# Patient Record
Sex: Female | Born: 1945
Health system: Southern US, Community
[De-identification: ages and names within clinical notes are randomized; demographics above are authoritative.]

## PROBLEM LIST (undated history)

## (undated) ENCOUNTER — Emergency Department (HOSPITAL_COMMUNITY): Admission: EM | Payer: 59

## (undated) DIAGNOSIS — Z789 Other specified health status: Secondary | ICD-10-CM

## (undated) HISTORY — PX: CERVICAL POLYPECTOMY: SHX88

## (undated) HISTORY — PX: COLONOSCOPY: SHX174

---

## 2002-10-15 ENCOUNTER — Ambulatory Visit (HOSPITAL_COMMUNITY): Admission: RE | Admit: 2002-10-15 | Discharge: 2002-10-15 | Payer: Self-pay | Admitting: Internal Medicine

## 2002-10-15 ENCOUNTER — Encounter: Payer: Self-pay | Admitting: Internal Medicine

## 2005-03-19 ENCOUNTER — Emergency Department (HOSPITAL_COMMUNITY): Admission: EM | Admit: 2005-03-19 | Discharge: 2005-03-19 | Payer: Self-pay | Admitting: Emergency Medicine

## 2005-05-02 ENCOUNTER — Ambulatory Visit (HOSPITAL_COMMUNITY): Admission: RE | Admit: 2005-05-02 | Discharge: 2005-05-02 | Payer: Self-pay | Admitting: Internal Medicine

## 2005-05-31 ENCOUNTER — Ambulatory Visit (HOSPITAL_COMMUNITY): Admission: RE | Admit: 2005-05-31 | Discharge: 2005-05-31 | Payer: Self-pay | Admitting: Internal Medicine

## 2005-12-27 ENCOUNTER — Ambulatory Visit (HOSPITAL_COMMUNITY): Admission: RE | Admit: 2005-12-27 | Discharge: 2005-12-27 | Payer: Self-pay | Admitting: Internal Medicine

## 2006-08-08 ENCOUNTER — Ambulatory Visit (HOSPITAL_COMMUNITY): Admission: RE | Admit: 2006-08-08 | Discharge: 2006-08-08 | Payer: Self-pay | Admitting: Internal Medicine

## 2007-09-18 ENCOUNTER — Ambulatory Visit (HOSPITAL_COMMUNITY): Admission: RE | Admit: 2007-09-18 | Discharge: 2007-09-18 | Payer: Self-pay | Admitting: Internal Medicine

## 2009-01-29 ENCOUNTER — Other Ambulatory Visit: Admission: RE | Admit: 2009-01-29 | Discharge: 2009-01-29 | Payer: Self-pay | Admitting: Obstetrics and Gynecology

## 2009-05-21 ENCOUNTER — Ambulatory Visit (HOSPITAL_COMMUNITY): Admission: RE | Admit: 2009-05-21 | Discharge: 2009-05-21 | Payer: Self-pay | Admitting: Orthopaedic Surgery

## 2010-02-20 ENCOUNTER — Emergency Department (HOSPITAL_COMMUNITY): Admission: EM | Admit: 2010-02-20 | Discharge: 2010-02-20 | Payer: Self-pay | Admitting: Emergency Medicine

## 2010-04-22 ENCOUNTER — Ambulatory Visit (HOSPITAL_COMMUNITY): Admission: RE | Admit: 2010-04-22 | Discharge: 2010-04-22 | Payer: Self-pay | Admitting: Internal Medicine

## 2010-05-26 ENCOUNTER — Ambulatory Visit: Payer: Self-pay | Admitting: Otolaryngology

## 2010-06-23 ENCOUNTER — Ambulatory Visit: Payer: Self-pay | Admitting: Otolaryngology

## 2010-08-25 ENCOUNTER — Ambulatory Visit (INDEPENDENT_AMBULATORY_CARE_PROVIDER_SITE_OTHER): Payer: BC Managed Care – PPO | Admitting: Otolaryngology

## 2010-08-25 DIAGNOSIS — R070 Pain in throat: Secondary | ICD-10-CM

## 2010-09-23 LAB — DIFFERENTIAL
Basophils Absolute: 0 10*3/uL (ref 0.0–0.1)
Basophils Relative: 0 % (ref 0–1)
Eosinophils Absolute: 0.1 10*3/uL (ref 0.0–0.7)
Eosinophils Relative: 3 % (ref 0–5)
Lymphocytes Relative: 34 % (ref 12–46)
Lymphs Abs: 1.5 10*3/uL (ref 0.7–4.0)
Monocytes Absolute: 0.4 10*3/uL (ref 0.1–1.0)
Monocytes Relative: 10 % (ref 3–12)
Neutro Abs: 2.4 10*3/uL (ref 1.7–7.7)
Neutrophils Relative %: 53 % (ref 43–77)

## 2010-09-23 LAB — CBC
HCT: 35.9 % — ABNORMAL LOW (ref 36.0–46.0)
Hemoglobin: 12.3 g/dL (ref 12.0–15.0)
MCH: 31.3 pg (ref 26.0–34.0)
MCHC: 34.1 g/dL (ref 30.0–36.0)
MCV: 91.6 fL (ref 78.0–100.0)
Platelets: 191 10*3/uL (ref 150–400)
RBC: 3.92 MIL/uL (ref 3.87–5.11)
RDW: 13.4 % (ref 11.5–15.5)
WBC: 4.4 10*3/uL (ref 4.0–10.5)

## 2010-09-23 LAB — BASIC METABOLIC PANEL
BUN: 11 mg/dL (ref 6–23)
CO2: 28 mEq/L (ref 19–32)
Calcium: 9.4 mg/dL (ref 8.4–10.5)
Chloride: 102 mEq/L (ref 96–112)
Creatinine, Ser: 0.61 mg/dL (ref 0.4–1.2)
GFR calc Af Amer: >60 mL/min (ref >60)
GFR calc non Af Amer: >60 mL/min (ref >60)
Glucose, Bld: 94 mg/dL (ref 70–99)
Potassium: 4 mEq/L (ref 3.5–5.1)
Sodium: 135 mEq/L (ref 135–145)

## 2010-09-23 LAB — POCT CARDIAC MARKERS
CKMB, poc: <1 ng/mL — ABNORMAL LOW (ref 1.0–8.0)
Myoglobin, poc: 50.7 ng/mL (ref 12–200)
Troponin i, poc: <0.05 ng/mL (ref 0.00–0.09)

## 2010-11-24 ENCOUNTER — Ambulatory Visit (INDEPENDENT_AMBULATORY_CARE_PROVIDER_SITE_OTHER): Payer: BC Managed Care – PPO | Admitting: Otolaryngology

## 2010-11-24 DIAGNOSIS — R070 Pain in throat: Secondary | ICD-10-CM

## 2011-01-09 ENCOUNTER — Other Ambulatory Visit (HOSPITAL_COMMUNITY)
Admission: RE | Admit: 2011-01-09 | Discharge: 2011-01-09 | Disposition: A | Discharge status: Home / Self Care | Payer: BC Managed Care – PPO | Source: Ambulatory Visit | Attending: Obstetrics & Gynecology | Admitting: Obstetrics & Gynecology

## 2011-01-09 DIAGNOSIS — Z124 Encounter for screening for malignant neoplasm of cervix: Secondary | ICD-10-CM | POA: Insufficient documentation

## 2012-01-09 ENCOUNTER — Other Ambulatory Visit (HOSPITAL_COMMUNITY): Payer: Self-pay | Admitting: Internal Medicine

## 2012-01-09 DIAGNOSIS — Z139 Encounter for screening, unspecified: Secondary | ICD-10-CM

## 2012-01-12 ENCOUNTER — Other Ambulatory Visit (HOSPITAL_COMMUNITY)
Admission: RE | Admit: 2012-01-12 | Discharge: 2012-01-12 | Disposition: A | Discharge status: Home / Self Care | Payer: BC Managed Care – PPO | Source: Ambulatory Visit | Attending: Obstetrics & Gynecology | Admitting: Obstetrics & Gynecology

## 2012-01-12 DIAGNOSIS — Z01419 Encounter for gynecological examination (general) (routine) without abnormal findings: Secondary | ICD-10-CM | POA: Insufficient documentation

## 2012-01-16 ENCOUNTER — Ambulatory Visit (HOSPITAL_COMMUNITY)
Admission: RE | Admit: 2012-01-16 | Discharge: 2012-01-16 | Disposition: A | Discharge status: Home / Self Care | Payer: BC Managed Care – PPO | Source: Ambulatory Visit | Attending: Internal Medicine | Admitting: Internal Medicine

## 2012-01-16 DIAGNOSIS — Z1231 Encounter for screening mammogram for malignant neoplasm of breast: Secondary | ICD-10-CM | POA: Insufficient documentation

## 2012-01-16 DIAGNOSIS — Z139 Encounter for screening, unspecified: Secondary | ICD-10-CM

## 2012-04-16 DIAGNOSIS — H40059 Ocular hypertension, unspecified eye: Secondary | ICD-10-CM | POA: Diagnosis not present

## 2012-12-10 ENCOUNTER — Other Ambulatory Visit (HOSPITAL_COMMUNITY): Payer: Self-pay | Admitting: Internal Medicine

## 2012-12-10 DIAGNOSIS — Z139 Encounter for screening, unspecified: Secondary | ICD-10-CM

## 2013-01-16 ENCOUNTER — Encounter: Payer: Self-pay | Admitting: Obstetrics and Gynecology

## 2013-01-16 ENCOUNTER — Ambulatory Visit (INDEPENDENT_AMBULATORY_CARE_PROVIDER_SITE_OTHER): Payer: Medicare Other | Admitting: Obstetrics and Gynecology

## 2013-01-16 VITALS — BP 140/60 | Ht 69.75 in | Wt 136.0 lb

## 2013-01-16 DIAGNOSIS — Z124 Encounter for screening for malignant neoplasm of cervix: Secondary | ICD-10-CM

## 2013-01-16 DIAGNOSIS — Z1212 Encounter for screening for malignant neoplasm of rectum: Secondary | ICD-10-CM

## 2013-01-16 DIAGNOSIS — Z01419 Encounter for gynecological examination (general) (routine) without abnormal findings: Secondary | ICD-10-CM

## 2013-01-16 LAB — HEMOCCULT GUIAC POC 1CARD (OFFICE): Fecal Occult Blood, POC: NEGATIVE

## 2013-01-16 NOTE — Progress Notes (Signed)
Patient ID: Stacy Chase, female   DOB: Jul 15, 1945, 67 y.o.   MRN: [id number] Pt here today for her annual exam. Pt blood pressure elevated 170/72.  Assessment:  Normal Gyn Exam   Plan:  1. Mammogram advised 2. pap smear not required 3. return annually or prn  Subjective:  Stacy Chase is a 67 y.o. female No obstetric history on file. who presents for annual exam.  The patient has complaints today of  none  The following portions of the patient's history were reviewed and updated as appropriate: allergies, current medications, past family history, past medical history, past social history, past surgical history and problem list.  Review of Systems Pertinent items are noted in HPI. walks 42mi/  Objective:  BP 140/60  Ht 5' 9.75" (1.772 m)  Wt 136 lb (61.689 kg)  BMI 19.65 kg/m2  BMI: Body mass index is 19.65 kg/(m^2). General Appearance: Alert, appropriate appearance for age. No acute distress HEENT: Grossly normal Neck / Thyroid: normal Cardiovascular: RRR; normal S1, S2, no murmur Lungs: CTA bilaterally Back: No CVAT Breast Exam: No dimpling, nipple retraction or discharge. No masses or nodes., Normal breast tissue bilaterally and No masses or nodes.No dimpling, nipple retraction or discharge. Gastrointestinal: Soft, non-tender, no masses or organomegaly Pelvic Exam: Vulva and vagina appear normal. Bimanual exam reveals normal uterus and adnexa. atrophic Rectovaginal: normal rectal, no masses and guaiac negative stool obtained Lymphatic Exam: Non-palpable nodes in neck, clavicular, axillary, or inguinal regions Skin: no rash or abnormalities Neurologic: Normal gait and speech, no tremor  Psychiatric: Alert and oriented, appropriate affect.  Urinalysis:Not done  Stacy Chase. MD Pgr [phone] 10:07 AM

## 2013-01-17 ENCOUNTER — Ambulatory Visit (HOSPITAL_COMMUNITY)
Admission: RE | Admit: 2013-01-17 | Discharge: 2013-01-17 | Disposition: A | Discharge status: Home / Self Care | Payer: Medicare Other | Source: Ambulatory Visit | Attending: Internal Medicine | Admitting: Internal Medicine

## 2013-01-17 DIAGNOSIS — Z1231 Encounter for screening mammogram for malignant neoplasm of breast: Secondary | ICD-10-CM | POA: Insufficient documentation

## 2013-01-17 DIAGNOSIS — Z139 Encounter for screening, unspecified: Secondary | ICD-10-CM

## 2013-02-24 DIAGNOSIS — K219 Gastro-esophageal reflux disease without esophagitis: Secondary | ICD-10-CM | POA: Diagnosis not present

## 2013-02-24 DIAGNOSIS — Z79899 Other long term (current) drug therapy: Secondary | ICD-10-CM | POA: Diagnosis not present

## 2013-07-08 ENCOUNTER — Other Ambulatory Visit (HOSPITAL_COMMUNITY): Payer: Self-pay | Admitting: Internal Medicine

## 2013-07-08 ENCOUNTER — Ambulatory Visit (HOSPITAL_COMMUNITY)
Admission: RE | Admit: 2013-07-08 | Discharge: 2013-07-08 | Disposition: A | Discharge status: Home / Self Care | Payer: Medicare Other | Source: Ambulatory Visit | Attending: Internal Medicine | Admitting: Internal Medicine

## 2013-07-08 DIAGNOSIS — R0781 Pleurodynia: Secondary | ICD-10-CM

## 2013-07-08 DIAGNOSIS — R079 Chest pain, unspecified: Secondary | ICD-10-CM | POA: Insufficient documentation

## 2014-03-09 ENCOUNTER — Encounter (INDEPENDENT_AMBULATORY_CARE_PROVIDER_SITE_OTHER): Payer: Self-pay | Admitting: *Deleted

## 2014-03-10 ENCOUNTER — Encounter (INDEPENDENT_AMBULATORY_CARE_PROVIDER_SITE_OTHER): Payer: Self-pay

## 2014-03-17 ENCOUNTER — Other Ambulatory Visit (HOSPITAL_COMMUNITY): Payer: Self-pay | Admitting: Internal Medicine

## 2014-03-17 DIAGNOSIS — Z1231 Encounter for screening mammogram for malignant neoplasm of breast: Secondary | ICD-10-CM

## 2014-03-20 ENCOUNTER — Other Ambulatory Visit (INDEPENDENT_AMBULATORY_CARE_PROVIDER_SITE_OTHER): Payer: Self-pay | Admitting: *Deleted

## 2014-03-20 DIAGNOSIS — Z86010 Personal history of colonic polyps: Secondary | ICD-10-CM

## 2014-03-24 DIAGNOSIS — IMO0002 Reserved for concepts with insufficient information to code with codable children: Secondary | ICD-10-CM | POA: Diagnosis not present

## 2014-03-24 DIAGNOSIS — M19049 Primary osteoarthritis, unspecified hand: Secondary | ICD-10-CM | POA: Diagnosis not present

## 2014-03-25 ENCOUNTER — Ambulatory Visit (HOSPITAL_COMMUNITY)
Admission: RE | Admit: 2014-03-25 | Discharge: 2014-03-25 | Disposition: A | Discharge status: Home / Self Care | Payer: Medicare Other | Source: Ambulatory Visit | Attending: Internal Medicine | Admitting: Internal Medicine

## 2014-03-25 DIAGNOSIS — Z1231 Encounter for screening mammogram for malignant neoplasm of breast: Secondary | ICD-10-CM | POA: Insufficient documentation

## 2014-04-01 DIAGNOSIS — L259 Unspecified contact dermatitis, unspecified cause: Secondary | ICD-10-CM | POA: Diagnosis not present

## 2014-04-28 DIAGNOSIS — S83241A Other tear of medial meniscus, current injury, right knee, initial encounter: Secondary | ICD-10-CM | POA: Diagnosis not present

## 2014-05-11 ENCOUNTER — Telehealth (INDEPENDENT_AMBULATORY_CARE_PROVIDER_SITE_OTHER): Payer: Self-pay | Admitting: *Deleted

## 2014-05-11 DIAGNOSIS — Z1211 Encounter for screening for malignant neoplasm of colon: Secondary | ICD-10-CM

## 2014-05-11 NOTE — Telephone Encounter (Signed)
Patient needs movi prep 

## 2014-05-12 ENCOUNTER — Telehealth (INDEPENDENT_AMBULATORY_CARE_PROVIDER_SITE_OTHER): Payer: Self-pay | Admitting: *Deleted

## 2014-05-12 MED ORDER — PEG-KCL-NACL-NASULF-NA ASC-C 100 G PO SOLR: 1.0000 | Freq: Once | ORAL | Status: DC

## 2014-05-12 NOTE — Telephone Encounter (Signed)
Referring MD/PCP: fagan   Procedure: tcs  Reason/Indication:  Hx polyps  Has patient had this procedure before?  Yes, 2010 -- scanned  If so, when, by whom and where?    Is there a family history of colon cancer?  no  Who?  What age when diagnosed?    Is patient diabetic?   no      Does patient have prosthetic heart valve?  no  Do you have a pacemaker?  no  Has patient ever had endocarditis? no  Has patient had joint replacement within last 12 months?  no  Does patient tend to be constipated or take laxatives? no  Is patient on Coumadin, Plavix and/or Aspirin? no  Medications: none  Allergies: morphine  Medication Adjustment:   Procedure date & time: 06/10/14 at 930

## 2014-05-12 NOTE — Telephone Encounter (Signed)
agree

## 2014-06-09 DIAGNOSIS — K6389 Other specified diseases of intestine: Secondary | ICD-10-CM | POA: Diagnosis not present

## 2014-06-09 DIAGNOSIS — D128 Benign neoplasm of rectum: Secondary | ICD-10-CM

## 2014-06-09 DIAGNOSIS — Z86010 Personal history of colonic polyps: Secondary | ICD-10-CM | POA: Diagnosis not present

## 2014-06-10 ENCOUNTER — Encounter (HOSPITAL_COMMUNITY): Payer: Self-pay | Admitting: *Deleted

## 2014-06-10 ENCOUNTER — Encounter (HOSPITAL_COMMUNITY)
Admission: RE | Disposition: A | Discharge status: Home / Self Care | Payer: Self-pay | Source: Ambulatory Visit | Attending: Internal Medicine

## 2014-06-10 ENCOUNTER — Ambulatory Visit (HOSPITAL_COMMUNITY)
Admission: RE | Admit: 2014-06-10 | Discharge: 2014-06-10 | Disposition: A | Discharge status: Home / Self Care | Payer: Medicare Other | Source: Ambulatory Visit | Attending: Internal Medicine | Admitting: Internal Medicine

## 2014-06-10 DIAGNOSIS — D128 Benign neoplasm of rectum: Secondary | ICD-10-CM | POA: Diagnosis not present

## 2014-06-10 DIAGNOSIS — K621 Rectal polyp: Secondary | ICD-10-CM | POA: Insufficient documentation

## 2014-06-10 DIAGNOSIS — Z86010 Personal history of colonic polyps: Secondary | ICD-10-CM | POA: Diagnosis not present

## 2014-06-10 DIAGNOSIS — Z1211 Encounter for screening for malignant neoplasm of colon: Secondary | ICD-10-CM | POA: Insufficient documentation

## 2014-06-10 DIAGNOSIS — K644 Residual hemorrhoidal skin tags: Secondary | ICD-10-CM | POA: Diagnosis not present

## 2014-06-10 DIAGNOSIS — K6389 Other specified diseases of intestine: Secondary | ICD-10-CM | POA: Diagnosis not present

## 2014-06-10 HISTORY — DX: Other specified health status: Z78.9

## 2014-06-10 HISTORY — PX: COLONOSCOPY: SHX5424

## 2014-06-10 SURGERY — COLONOSCOPY
Anesthesia: Moderate Sedation

## 2014-06-10 MED ORDER — MEPERIDINE HCL 50 MG/ML IJ SOLN
INTRAMUSCULAR | Status: DC | PRN
  Administered 2014-06-10 (×2): 25 mg via INTRAVENOUS

## 2014-06-10 MED ORDER — MIDAZOLAM HCL 5 MG/5ML IJ SOLN
INTRAMUSCULAR | Status: AC
  Filled 2014-06-10: qty 10

## 2014-06-10 MED ORDER — MEPERIDINE HCL 50 MG/ML IJ SOLN
INTRAMUSCULAR | Status: AC
  Filled 2014-06-10: qty 1

## 2014-06-10 MED ORDER — SODIUM CHLORIDE 0.9 % IV SOLN
INTRAVENOUS | Status: DC
  Administered 2014-06-10: 09:00:00 via INTRAVENOUS

## 2014-06-10 MED ORDER — SIMETHICONE 40 MG/0.6ML PO SUSP
ORAL | Status: DC | PRN
  Administered 2014-06-10: 10:00:00

## 2014-06-10 MED ORDER — MIDAZOLAM HCL 5 MG/5ML IJ SOLN
INTRAMUSCULAR | Status: DC | PRN
Start: 2014-06-10 — End: 2014-06-10
  Administered 2014-06-10: 1 mg via INTRAVENOUS
  Administered 2014-06-10 (×2): 2 mg via INTRAVENOUS

## 2014-06-10 NOTE — H&P (Signed)
Stacy Chase is an 68 y.o. female.   Chief Complaint: Patient is here for colonoscopy. HPI: Patient is 68 year old African-American female with history of colonic adenoma and is here for surveillance colonoscopy. She denies abdominal pain change in bowel habits rectal bleeding. Family history is negative for CRC.  Past Medical History  Diagnosis Date  . Medical history non-contributory     Past Surgical History  Procedure Laterality Date  . Cervical polypectomy    . Colonoscopy      Family History  Problem Relation Age of Onset  . Hypertension Mother   . Diabetes Sister   . Cancer Sister     breast  . Hypertension Sister   . Cancer Brother     throat  . Hypertension Maternal Grandmother   . Hypertension Maternal Grandfather   . Diabetes Sister    Social History:  reports that she has never smoked. She does not have any smokeless tobacco history on file. She reports that she does not drink alcohol or use illicit drugs.  Allergies:  Allergies  Allergen Reactions  . Cortisone Rash  . Morphine And Related Rash    Medications Prior to Admission  Medication Sig Dispense Refill  . peg 3350 powder (MOVIPREP) 100 G SOLR Take 1 kit (200 g total) by mouth once. 1 kit 0    No results found for this or any previous visit (from the past 48 hour(s)). No results found.  ROS  Blood pressure 157/64, pulse 60, temperature 97.5 F (36.4 C), temperature source Oral, resp. rate 15, height 5' 10.5" (1.791 m), weight 140 lb (63.504 kg), SpO2 100 %. Physical Exam  Constitutional:  Well-developed thin African-American female in NAD  HENT:  Mouth/Throat: Oropharynx is clear and moist.  Eyes: Conjunctivae are normal. No scleral icterus.  Neck: No thyromegaly present.  Cardiovascular: Normal rate, regular rhythm and normal heart sounds.   No murmur heard. Respiratory: Effort normal and breath sounds normal.  GI: Soft. She exhibits no distension and no mass. There is no tenderness.   Musculoskeletal: She exhibits no edema.  Lymphadenopathy:    She has no cervical adenopathy.  Neurological: She is alert.  Skin: Skin is warm and dry.     Assessment/Plan History of colonic adenoma. Surveillance colonoscopy.  Holt Woolbright U 06/10/2014, 9:40 AM

## 2014-06-10 NOTE — Op Note (Signed)
COLONOSCOPY PROCEDURE REPORT  PATIENT:  Stacy Chase  MR#:  [id number] Birthdate:  Nov 11, 1945, 68 y.o., female Endoscopist:  Dr. Rogene Houston, MD Referred By:  Dr. Asencion Noble, MD  Procedure Date: 06/10/2014  Procedure:   Colonoscopy  Indications:  Patient is 68 year old African-American female was history of colonic adenoma and is here for surveillance colonoscopy. Her last exam was in September 2010.  Informed Consent:  The procedure and risks were reviewed with the patient and informed consent was obtained.  Medications:  Demerol 50 mg IV Versed 5 mg IV  Description of procedure:  After a digital rectal exam was performed, that colonoscope was advanced from the anus through the rectum and colon to the area of the cecum, ileocecal valve and appendiceal orifice. The cecum was deeply intubated. These structures were well-seen and photographed for the record. From the level of the cecum and ileocecal valve, the scope was slowly and cautiously withdrawn. The mucosal surfaces were carefully surveyed utilizing scope tip to flexion to facilitate fold flattening as needed. The scope was pulled down into the rectum where a thorough exam including retroflexion was performed.  Findings:   Prep excellent. Mild mucosal pigmentation involving distal half of the colon. Two small rectal polyps were ablated via cold biopsy and submitted together. Small hemorrhoids below the dentate line.    Therapeutic/Diagnostic Maneuvers Performed:  See above  Complications:  None  Cecal Withdrawal Time:  12 minutes  Impression:  Examination performed to cecum. Mild changes of melanosis coli involving distal half of the colon. Two small rectal polyps ablated via cold biopsy and submitted together. Small external hemorrhoids.  Recommendations:  Standard instructions given. I will contact patient with biopsy results and further recommendations.  Leannah Guse U  06/10/2014 10:21 AM  CC: Dr. Asencion Noble, MD &  Dr. Rayne Du ref. provider found

## 2014-06-10 NOTE — Discharge Instructions (Signed)
Resume usual diet. No driving for 24 hours. Physician will call with biopsy results.  Colonoscopy, Care After Refer to this sheet in the next few weeks. These instructions provide you with information on caring for yourself after your procedure. Your health care provider may also give you more specific instructions. Your treatment has been planned according to current medical practices, but problems sometimes occur. Call your health care provider if you have any problems or questions after your procedure. WHAT TO EXPECT AFTER THE PROCEDURE  After your procedure, it is typical to have the following:  A small amount of blood in your stool.  Moderate amounts of gas and mild abdominal cramping or bloating. HOME CARE INSTRUCTIONS  Do not drive, operate machinery, or sign important documents for 24 hours.  You may shower and resume your regular physical activities, but move at a slower pace for the first 24 hours.  Take frequent rest periods for the first 24 hours.  Walk around or put a warm pack on your abdomen to help reduce abdominal cramping and bloating.  Drink enough fluids to keep your urine clear or pale yellow.  You may resume your normal diet as instructed by your health care provider. Avoid heavy or fried foods that are hard to digest.  Avoid drinking alcohol for 24 hours or as instructed by your health care provider.  Only take over-the-counter or prescription medicines as directed by your health care provider.  If a tissue sample (biopsy) was taken during your procedure:  Do not take aspirin or blood thinners for 7 days, or as instructed by your health care provider.  Do not drink alcohol for 7 days, or as instructed by your health care provider.  Eat soft foods for the first 24 hours. SEEK MEDICAL CARE IF: You have persistent spotting of blood in your stool 2-3 days after the procedure. SEEK IMMEDIATE MEDICAL CARE IF:  You have more than a small spotting of blood in  your stool.  You pass large blood clots in your stool.  Your abdomen is swollen (distended).  You have nausea or vomiting.  You have a fever.  You have increasing abdominal pain that is not relieved with medicine. Document Released: 02/08/2004 Document Revised: 04/16/2013 Document Reviewed: 03/03/2013 [facility] Patient Information 2015 [place], [place]. This information is not intended to replace advice given to you by your health care provider. Make sure you discuss any questions you have with your health care provider.

## 2014-06-11 ENCOUNTER — Encounter (HOSPITAL_COMMUNITY): Payer: Self-pay | Admitting: Internal Medicine

## 2014-06-22 ENCOUNTER — Encounter (INDEPENDENT_AMBULATORY_CARE_PROVIDER_SITE_OTHER): Payer: Self-pay | Admitting: *Deleted

## 2014-11-04 DIAGNOSIS — J209 Acute bronchitis, unspecified: Secondary | ICD-10-CM | POA: Diagnosis not present

## 2015-02-05 DIAGNOSIS — L237 Allergic contact dermatitis due to plants, except food: Secondary | ICD-10-CM | POA: Diagnosis not present

## 2015-02-05 DIAGNOSIS — Z6820 Body mass index (BMI) 20.0-20.9, adult: Secondary | ICD-10-CM | POA: Diagnosis not present

## 2015-02-06 DIAGNOSIS — E785 Hyperlipidemia, unspecified: Secondary | ICD-10-CM | POA: Diagnosis not present

## 2015-02-06 DIAGNOSIS — D72819 Decreased white blood cell count, unspecified: Secondary | ICD-10-CM | POA: Diagnosis not present

## 2015-02-11 ENCOUNTER — Other Ambulatory Visit (HOSPITAL_COMMUNITY): Payer: Self-pay | Admitting: Internal Medicine

## 2015-02-11 DIAGNOSIS — Z1231 Encounter for screening mammogram for malignant neoplasm of breast: Secondary | ICD-10-CM

## 2015-03-30 DIAGNOSIS — B86 Scabies: Secondary | ICD-10-CM | POA: Diagnosis not present

## 2015-03-30 DIAGNOSIS — Z681 Body mass index (BMI) 19 or less, adult: Secondary | ICD-10-CM | POA: Diagnosis not present

## 2015-03-31 ENCOUNTER — Ambulatory Visit (HOSPITAL_COMMUNITY)
Admission: RE | Admit: 2015-03-31 | Discharge: 2015-03-31 | Disposition: A | Discharge status: Home / Self Care | Payer: Medicare Other | Source: Ambulatory Visit | Attending: Internal Medicine | Admitting: Internal Medicine

## 2015-03-31 DIAGNOSIS — Z1231 Encounter for screening mammogram for malignant neoplasm of breast: Secondary | ICD-10-CM | POA: Diagnosis not present

## 2015-05-26 DIAGNOSIS — H40023 Open angle with borderline findings, high risk, bilateral: Secondary | ICD-10-CM | POA: Diagnosis not present

## 2015-06-21 DIAGNOSIS — H40053 Ocular hypertension, bilateral: Secondary | ICD-10-CM | POA: Diagnosis not present

## 2015-08-11 DIAGNOSIS — E785 Hyperlipidemia, unspecified: Secondary | ICD-10-CM | POA: Diagnosis not present

## 2015-08-11 DIAGNOSIS — Z79899 Other long term (current) drug therapy: Secondary | ICD-10-CM | POA: Diagnosis not present

## 2015-08-19 DIAGNOSIS — R05 Cough: Secondary | ICD-10-CM | POA: Diagnosis not present

## 2015-08-19 DIAGNOSIS — Z681 Body mass index (BMI) 19 or less, adult: Secondary | ICD-10-CM | POA: Diagnosis not present

## 2015-08-19 DIAGNOSIS — E785 Hyperlipidemia, unspecified: Secondary | ICD-10-CM | POA: Diagnosis not present

## 2015-08-25 ENCOUNTER — Other Ambulatory Visit (HOSPITAL_COMMUNITY)
Admission: RE | Admit: 2015-08-25 | Discharge: 2015-08-25 | Disposition: A | Discharge status: Home / Self Care | Payer: Medicare Other | Source: Ambulatory Visit | Attending: Obstetrics and Gynecology | Admitting: Obstetrics and Gynecology

## 2015-08-25 ENCOUNTER — Ambulatory Visit (INDEPENDENT_AMBULATORY_CARE_PROVIDER_SITE_OTHER): Payer: Medicare Other | Admitting: Obstetrics and Gynecology

## 2015-08-25 ENCOUNTER — Encounter: Payer: Self-pay | Admitting: Obstetrics and Gynecology

## 2015-08-25 VITALS — BP 110/60 | Ht 69.5 in | Wt 134.0 lb

## 2015-08-25 DIAGNOSIS — Z1151 Encounter for screening for human papillomavirus (HPV): Secondary | ICD-10-CM | POA: Insufficient documentation

## 2015-08-25 DIAGNOSIS — Z124 Encounter for screening for malignant neoplasm of cervix: Secondary | ICD-10-CM | POA: Diagnosis not present

## 2015-08-25 DIAGNOSIS — Z01411 Encounter for gynecological examination (general) (routine) with abnormal findings: Secondary | ICD-10-CM | POA: Insufficient documentation

## 2015-08-25 DIAGNOSIS — Z01419 Encounter for gynecological examination (general) (routine) without abnormal findings: Secondary | ICD-10-CM

## 2015-08-25 NOTE — Progress Notes (Signed)
Patient ID: Stacy Chase, female   DOB: Aug 10, 1945, 70 y.o.   MRN: [id number] Pt here today for annual exam. Pt denies any problems or concerns at this time.

## 2015-08-25 NOTE — Progress Notes (Signed)
Patient ID: Stacy Chase, female   DOB: 10/24/1945, 70 y.o.   MRN: [id number]   Assessment:  Annual Gyn Exam Normal Exam for postmeno female. Plan:  1. pap smear done, next pap due in 2020 2. return annually or prn 3    Annual mammogram advised 4 screening  Labs by Stacy Chase 2/1 Subjective:  Yesly Stacy Chase is a 70 y.o. female with a history of cervical polpectomy who presents for annual exam. No LMP recorded. Patient is postmenopausal. The patient has no complaints. She states she has mammograms done over the past year. She states she is not currently sexually active.   PCP: Dr. Willey Chase  The following portions of the patient's history were reviewed and updated as appropriate: allergies, current medications, past family history, past medical history, past social history, past surgical history and problem list. Past Medical History  Diagnosis Date  . Medical history non-contributory     Past Surgical History  Procedure Laterality Date  . Cervical polypectomy    . Colonoscopy    . Colonoscopy N/A 06/10/2014    Procedure: COLONOSCOPY;  Surgeon: Stacy Houston, MD;  Location: [facility];  Service: Endoscopy;  Laterality: N/A;  930     Current outpatient prescriptions:  .  atorvastatin (LIPITOR) 10 MG tablet, , Disp: , Rfl:  .  benzonatate (TESSALON) 200 MG capsule, , Disp: , Rfl:   Review of Systems Constitutional: negative Gastrointestinal: negative Genitourinary: negative A complete 10 system review of systems was obtained and all systems are negative except as noted in the HPI and PMH.    Objective:  BP 110/60 mmHg  Ht 5' 9.5" (1.765 m)  Wt 134 lb (60.782 kg)  BMI 19.51 kg/m2   BMI: Body mass index is 19.51 kg/(m^2).  General Appearance: Alert, appropriate appearance for age. No acute distress HEENT: Grossly normal Neck / Thyroid:  Cardiovascular: RRR; normal S1, S2, no murmur Lungs: CTA bilaterally Back: No CVAT Breast Exam: Normal to inspection, Normal breast tissue  bilaterally and No masses or nodes.No dimpling, nipple retraction or discharge. Gastrointestinal: Soft, non-tender, no masses or organomegaly Pelvic Exam: Vulva and vagina appear normal. Bimanual exam reveals normal uterus and adnexa. External genitalia: normal general appearance Urinary system: urethral meatus normal Vaginal: good support, atrophic mucosa Cervix: normal appearance, small Adnexa: normal bimanual exam Uterus: tiny, single, nontender Rectal: good sphincter tone, no masses and guaiac negative Rectovaginal: normal rectal, no masses Lymphatic Exam: Non-palpable nodes in neck, clavicular, axillary, or inguinal regions  Skin: no rash or abnormalities Neurologic: Normal gait and speech, no tremor  Psychiatric: Alert and oriented, appropriate affect.  Urinalysis:Not done  Mallory Shirk. MD Pgr [phone] 11:33 AM   By signing my name below, I, Stacy Chase, attest that this documentation has been prepared under the direction and in the presence of Stacy Kind, MD. Electronically Signed: Stephania Chase, ED Scribe. 08/25/2015. 11:42 AM.  I personally performed the services described in this documentation, which was SCRIBED in my presence. The recorded information has been reviewed and considered accurate. It has been edited as necessary during review. Stacy Kind, MD

## 2015-08-27 LAB — CYTOLOGY - PAP

## 2016-02-29 ENCOUNTER — Other Ambulatory Visit (HOSPITAL_COMMUNITY): Payer: Self-pay | Admitting: Internal Medicine

## 2016-02-29 DIAGNOSIS — Z1231 Encounter for screening mammogram for malignant neoplasm of breast: Secondary | ICD-10-CM

## 2016-03-31 ENCOUNTER — Ambulatory Visit (HOSPITAL_COMMUNITY)
Admission: RE | Admit: 2016-03-31 | Discharge: 2016-03-31 | Disposition: A | Discharge status: Home / Self Care | Payer: Medicare Other | Source: Ambulatory Visit | Attending: Internal Medicine | Admitting: Internal Medicine

## 2016-03-31 DIAGNOSIS — Z1231 Encounter for screening mammogram for malignant neoplasm of breast: Secondary | ICD-10-CM | POA: Diagnosis not present

## 2016-08-23 DIAGNOSIS — Z79899 Other long term (current) drug therapy: Secondary | ICD-10-CM | POA: Diagnosis not present

## 2016-08-23 DIAGNOSIS — E785 Hyperlipidemia, unspecified: Secondary | ICD-10-CM | POA: Diagnosis not present

## 2016-08-30 DIAGNOSIS — Z6820 Body mass index (BMI) 20.0-20.9, adult: Secondary | ICD-10-CM | POA: Diagnosis not present

## 2016-08-30 DIAGNOSIS — Z23 Encounter for immunization: Secondary | ICD-10-CM | POA: Diagnosis not present

## 2016-08-30 DIAGNOSIS — E785 Hyperlipidemia, unspecified: Secondary | ICD-10-CM | POA: Diagnosis not present

## 2016-12-05 DIAGNOSIS — H40013 Open angle with borderline findings, low risk, bilateral: Secondary | ICD-10-CM | POA: Diagnosis not present

## 2016-12-29 ENCOUNTER — Ambulatory Visit (HOSPITAL_COMMUNITY)
Admission: RE | Admit: 2016-12-29 | Discharge: 2016-12-29 | Disposition: A | Discharge status: Home / Self Care | Payer: Medicare Other | Source: Ambulatory Visit | Attending: Internal Medicine | Admitting: Internal Medicine

## 2016-12-29 ENCOUNTER — Other Ambulatory Visit (HOSPITAL_COMMUNITY): Payer: Self-pay | Admitting: Internal Medicine

## 2016-12-29 DIAGNOSIS — M545 Low back pain: Secondary | ICD-10-CM | POA: Insufficient documentation

## 2016-12-29 DIAGNOSIS — M47816 Spondylosis without myelopathy or radiculopathy, lumbar region: Secondary | ICD-10-CM | POA: Diagnosis not present

## 2016-12-29 DIAGNOSIS — R202 Paresthesia of skin: Secondary | ICD-10-CM | POA: Diagnosis not present

## 2017-01-23 DIAGNOSIS — H2513 Age-related nuclear cataract, bilateral: Secondary | ICD-10-CM | POA: Diagnosis not present

## 2017-01-24 NOTE — Patient Instructions (Signed)
Stacy Chase  01/24/2017     @PREFPERIOPPHARMACY @   Your procedure is scheduled on 01/30/2017.  Report to [facility] at 7:00 A.M.  Call this number if you have problems the morning of surgery:  [phone]   Remember:  Do not eat food or drink liquids after midnight.  Take these medicines the morning of surgery with A SIP OF WATER None   Do not wear jewelry, make-up or nail polish.  Do not wear lotions, powders, or perfumes, or deoderant.  Do not shave 48 hours prior to surgery.  Men may shave face and neck.  Do not bring valuables to the hospital.  [facility] is not responsible for any belongings or valuables.  Contacts, dentures or bridgework may not be worn into surgery.  Leave your suitcase in the car.  After surgery it may be brought to your room.  For patients admitted to the hospital, discharge time will be determined by your treatment team.  Patients discharged the day of surgery will not be allowed to drive home.    Please read over the following fact sheets that you were given. Anesthesia Post-op Instructions     PATIENT INSTRUCTIONS POST-ANESTHESIA  IMMEDIATELY FOLLOWING SURGERY:  Do not drive or operate machinery for the first twenty four hours after surgery.  Do not make any important decisions for twenty four hours after surgery or while taking narcotic pain medications or sedatives.  If you develop intractable nausea and vomiting or a severe headache please notify your doctor immediately.  FOLLOW-UP:  Please make an appointment with your surgeon as instructed. You do not need to follow up with anesthesia unless specifically instructed to do so.  WOUND CARE INSTRUCTIONS (if applicable):  Keep a dry clean dressing on the anesthesia/puncture wound site if there is drainage.  Once the wound has quit draining you may leave it open to air.  Generally you should leave the bandage intact for twenty four hours unless there is drainage.  If the epidural site drains for  more than 36-48 hours please call the anesthesia department.  QUESTIONS?:  Please feel free to call your physician or the hospital operator if you have any questions, and they will be happy to assist you.      Cataract Surgery Cataract surgery is a procedure to remove a cataract from your eye. A cataract is cloudiness on the lens of your eye. The lens focuses light inside the eye. When a lens becomes cloudy, your vision is affected. Cataract surgery is a procedure to remove the cloudy lens. A substitute lens (intraocular lens or IOL) is usually inserted as a replacement for the cloudy lens. Tell a health care provider about:  Any allergies you have.  All medicines you are taking, including vitamins, herbs, eye drops, creams, and over-the-counter medicines.  Any problems you or family members have had with anesthetic medicines.  Any blood disorders you have.  Any surgeries you have had, especially eye surgeries that include refractive surgery, such as PRK and LASIK.  Any medical conditions you have.  Whether you are pregnant or may be pregnant. What are the risks? Generally, this is a safe procedure. However, problems may occur, including:  Infection.  Bleeding.  Glaucoma.  Retinal detachment.  Allergic reactions to medicines.  Damage to other structures or organs.  Inflammation of the eye.  Clouding of the part of your eye that holds an IOL in place (after-cataract), if an IOL was inserted. This is fairly common.  An IOL moving out of position, if an IOL was inserted. This is very rare.  Loss of vision. This is rare.  What happens before the procedure?  Follow instructions from your health care provider about eating or drinking restrictions.  Ask your health care provider about: ? Changing or stopping your regular medicines, including any eye drops you have been prescribed. This is especially important if you are taking diabetes medicines or blood thinners. ? Taking  medicines such as aspirin and ibuprofen. These medicines can thin your blood. Do not take these medicines before your procedure if your health care provider instructs you not to.  Do not put contact lenses in either eye on the day of your surgery.  Plan for someone to drive you to and from the procedure.  If you will be going home right after the procedure, plan to have someone with you for 24 hours. What happens during the procedure?  An IV tube may be inserted into one of your veins.  You will be given one or more of the following: ? A medicine to help you relax (sedative). ? A medicine to numb the area (local anesthetic). This may be numbing eye drops or an injection that is given behind the eye.  A small cut (incision) will be made to the edge of the clear, dome-shaped surface that covers the front of the eye (cornea).  A small probe will be inserted into the eye. This device gives off ultrasound waves that soften and break up the cloudy center of the lens. This makes it easier for the cloudy lens to be removed by suction.  An IOL may be implanted.  Part of the capsule that surrounds the lens will be left in the eye to support the IOL.  Your surgeon may use stitches (sutures) to close the incision. The procedure may vary among health care providers and hospitals. What happens after the procedure?  Your blood pressure, heart rate, breathing rate, and blood oxygen level will be monitored often until the medicines you were given have worn off.  You may be given a protective shield to wear over your eyes.  Do not drive for 24 hours if you received a sedative. This information is not intended to replace advice given to you by your health care provider. Make sure you discuss any questions you have with your health care provider. Document Released: 06/15/2011 Document Revised: 12/02/2015 Document Reviewed: 05/06/2015 Elsevier Interactive Patient Education  2017 [employer].

## 2017-01-25 ENCOUNTER — Encounter (HOSPITAL_COMMUNITY): Payer: Self-pay

## 2017-01-25 ENCOUNTER — Encounter (HOSPITAL_COMMUNITY)
Admission: RE | Admit: 2017-01-25 | Discharge: 2017-01-25 | Disposition: A | Discharge status: Home / Self Care | Payer: Medicare Other | Source: Ambulatory Visit | Attending: Ophthalmology | Admitting: Ophthalmology

## 2017-01-25 DIAGNOSIS — R9431 Abnormal electrocardiogram [ECG] [EKG]: Secondary | ICD-10-CM | POA: Insufficient documentation

## 2017-01-25 DIAGNOSIS — Z01810 Encounter for preprocedural cardiovascular examination: Secondary | ICD-10-CM | POA: Diagnosis not present

## 2017-01-25 DIAGNOSIS — Z01812 Encounter for preprocedural laboratory examination: Secondary | ICD-10-CM | POA: Diagnosis not present

## 2017-01-25 LAB — BASIC METABOLIC PANEL
Anion gap: 6 (ref 5–15)
BUN: 15 mg/dL (ref 6–20)
CALCIUM: 9.2 mg/dL (ref 8.9–10.3)
CO2: 28 mmol/L (ref 22–32)
CREATININE: 0.77 mg/dL (ref 0.44–1.00)
Chloride: 106 mmol/L (ref 101–111)
GFR calc Af Amer: >60 mL/min (ref >60)
GFR calc non Af Amer: >60 mL/min (ref >60)
Glucose, Bld: 82 mg/dL (ref 65–99)
Potassium: 4 mmol/L (ref 3.5–5.1)
Sodium: 140 mmol/L (ref 135–145)

## 2017-01-25 LAB — CBC
HCT: 35.8 % — ABNORMAL LOW (ref 36.0–46.0)
Hemoglobin: 11.7 g/dL — ABNORMAL LOW (ref 12.0–15.0)
MCH: 30.5 pg (ref 26.0–34.0)
MCHC: 32.7 g/dL (ref 30.0–36.0)
MCV: 93.2 fL (ref 78.0–100.0)
Platelets: 191 10*3/uL (ref 150–400)
RBC: 3.84 MIL/uL — ABNORMAL LOW (ref 3.87–5.11)
RDW: 13.6 % (ref 11.5–15.5)
WBC: 5.2 10*3/uL (ref 4.0–10.5)

## 2017-01-30 ENCOUNTER — Ambulatory Visit (HOSPITAL_COMMUNITY): Payer: Medicare Other | Admitting: Anesthesiology

## 2017-01-30 ENCOUNTER — Encounter (HOSPITAL_COMMUNITY)
Admission: RE | Disposition: A | Discharge status: Home / Self Care | Payer: Self-pay | Source: Ambulatory Visit | Attending: Ophthalmology

## 2017-01-30 ENCOUNTER — Ambulatory Visit (HOSPITAL_COMMUNITY)
Admission: RE | Admit: 2017-01-30 | Discharge: 2017-01-30 | Disposition: A | Discharge status: Home / Self Care | Payer: Medicare Other | Source: Ambulatory Visit | Attending: Ophthalmology | Admitting: Ophthalmology

## 2017-01-30 DIAGNOSIS — Z888 Allergy status to other drugs, medicaments and biological substances status: Secondary | ICD-10-CM | POA: Insufficient documentation

## 2017-01-30 DIAGNOSIS — Z885 Allergy status to narcotic agent status: Secondary | ICD-10-CM | POA: Diagnosis not present

## 2017-01-30 DIAGNOSIS — H269 Unspecified cataract: Secondary | ICD-10-CM | POA: Diagnosis not present

## 2017-01-30 DIAGNOSIS — H2511 Age-related nuclear cataract, right eye: Secondary | ICD-10-CM | POA: Diagnosis not present

## 2017-01-30 DIAGNOSIS — H2512 Age-related nuclear cataract, left eye: Secondary | ICD-10-CM | POA: Diagnosis not present

## 2017-01-30 HISTORY — PX: CATARACT EXTRACTION W/PHACO: SHX586

## 2017-01-30 SURGERY — PHACOEMULSIFICATION, CATARACT, WITH IOL INSERTION
Anesthesia: Monitor Anesthesia Care | Site: Eye | Laterality: Left

## 2017-01-30 MED ORDER — TETRACAINE HCL 0.5 % OP SOLN
1.0000 [drp] | OPHTHALMIC | Status: AC
  Administered 2017-01-30 (×3): 1 [drp] via OPHTHALMIC

## 2017-01-30 MED ORDER — CYCLOPENTOLATE-PHENYLEPHRINE 0.2-1 % OP SOLN
1.0000 [drp] | OPHTHALMIC | Status: AC
  Administered 2017-01-30 (×3): 1 [drp] via OPHTHALMIC

## 2017-01-30 MED ORDER — FENTANYL CITRATE (PF) 100 MCG/2ML IJ SOLN
25.0000 ug | Freq: Once | INTRAMUSCULAR | Status: AC
  Administered 2017-01-30: 25 ug via INTRAVENOUS

## 2017-01-30 MED ORDER — LACTATED RINGERS IV SOLN
INTRAVENOUS | Status: DC
  Administered 2017-01-30: 08:00:00 via INTRAVENOUS

## 2017-01-30 MED ORDER — BSS IO SOLN
INTRAOCULAR | Status: DC | PRN
  Administered 2017-01-30: 15 mL

## 2017-01-30 MED ORDER — PROVISC 10 MG/ML IO SOLN
INTRAOCULAR | Status: DC | PRN
  Administered 2017-01-30: 0.85 mL via INTRAOCULAR

## 2017-01-30 MED ORDER — MIDAZOLAM HCL 2 MG/2ML IJ SOLN
1.0000 mg | INTRAMUSCULAR | Status: AC
  Administered 2017-01-30 (×2): 1 mg via INTRAVENOUS

## 2017-01-30 MED ORDER — KETOROLAC TROMETHAMINE 0.5 % OP SOLN
1.0000 [drp] | OPHTHALMIC | Status: AC
  Administered 2017-01-30 (×3): 1 [drp] via OPHTHALMIC

## 2017-01-30 MED ORDER — PHENYLEPHRINE HCL 2.5 % OP SOLN
1.0000 [drp] | OPHTHALMIC | Status: AC
  Administered 2017-01-30 (×3): 1 [drp] via OPHTHALMIC

## 2017-01-30 MED ORDER — EPINEPHRINE PF 1 MG/ML IJ SOLN
INTRAOCULAR | Status: DC | PRN
  Administered 2017-01-30: 500 mL

## 2017-01-30 MED ORDER — MIDAZOLAM HCL 2 MG/2ML IJ SOLN
INTRAMUSCULAR | Status: AC
  Filled 2017-01-30: qty 2

## 2017-01-30 MED ORDER — FENTANYL CITRATE (PF) 100 MCG/2ML IJ SOLN
INTRAMUSCULAR | Status: AC
  Filled 2017-01-30: qty 2

## 2017-01-30 SURGICAL SUPPLY — 11 items
CLOTH BEACON ORANGE TIMEOUT ST (SAFETY) ×3 IMPLANT
EYE SHIELD UNIVERSAL CLEAR (GAUZE/BANDAGES/DRESSINGS) ×3 IMPLANT
GLOVE BIO SURGEON STRL SZ 6.5 (GLOVE) ×2 IMPLANT
GLOVE BIO SURGEONS STRL SZ 6.5 (GLOVE) ×1
GLOVE BIOGEL PI IND STRL 7.0 (GLOVE) ×1 IMPLANT
GLOVE BIOGEL PI INDICATOR 7.0 (GLOVE) ×2
LENS ALC ACRYL/TECN (Ophthalmic Related) ×3 IMPLANT
PAD ARMBOARD 7.5X6 YLW CONV (MISCELLANEOUS) ×3 IMPLANT
TAPE SURG TRANSPORE 1 IN (GAUZE/BANDAGES/DRESSINGS) ×1 IMPLANT
TAPE SURGICAL TRANSPORE 1 IN (GAUZE/BANDAGES/DRESSINGS) ×2
WATER STERILE IRR 250ML POUR (IV SOLUTION) ×3 IMPLANT

## 2017-01-30 NOTE — H&P (Signed)
The patient was re examined and there is no change in the patients condition since the original H and P. 

## 2017-01-30 NOTE — Transfer of Care (Signed)
Immediate Anesthesia Transfer of Care Note  Patient: Stacy Chase  Procedure(s) Performed: Procedure(s) with comments: CATARACT EXTRACTION PHACO AND INTRAOCULAR LENS PLACEMENT (IOC) (Left) - CDE: 14.94  Patient Location: PACU  Anesthesia Type:MAC  Level of Consciousness: awake and alert   Airway & Oxygen Therapy: Patient Spontanous Breathing  Post-op Assessment: Report given to RN and Post -op Vital signs reviewed and stable  Post vital signs: Reviewed and stable  Last Vitals:  Vitals:   01/30/17 0825 01/30/17 0827  BP:    Resp: 10   Temp:  36.7 C    Last Pain:  Vitals:   01/30/17 0827  TempSrc: Oral      Patients Stated Pain Goal: 4 (93/57/01 7793)  Complications: No apparent anesthesia complications

## 2017-01-30 NOTE — Discharge Instructions (Signed)
°  °          [facility] Instructions °[address]- [place] [address] °    ° °1. Avoid closing eyes tightly. One often closes the eye tightly when laughing, talking, sneezing, coughing or if they feel irritated. At these times, you should be careful not to close your eyes tightly. ° °2. Instill eye drops as instructed. To instill drops in your eye, open it, look up and have someone gently pull the lower lid down and instill a couple of drops inside the lower lid. ° °3. Do not touch upper lid. ° °4. Take Advil or Tylenol for pain. ° °5. You may use either eye for near work, such as reading or sewing and you may watch television. ° °6. You may have your hair done at the beauty parlor at any time. ° °7. Wear dark glasses with or without your own glasses if you are in bright light. ° °8. Call our office at [phone] or [phone] if you have sharp pain in your eye or unusual symptoms. ° °9.  FOLLOW UP WITH DR. SHAPIRO TODAY IN HIS [place] OFFICE AT 2:45pm. ° °  °I have received a copy of the above instructions and will follow them.  ° ° ° °IF YOU ARE IN IMMEDIATE DANGER CALL 911! ° °It is important for you to keep your follow-up appointment with your physician after discharge, OR, for you /your caregiver to make a follow-up appointment with your physician / medical provider after discharge. ° °Show these instructions to the next healthcare provider you see. ° °

## 2017-01-30 NOTE — Anesthesia Preprocedure Evaluation (Signed)
Anesthesia Evaluation  Patient identified by MRN, date of birth, ID band Patient awake    Reviewed: Allergy & Precautions, NPO status , Patient's Chart, lab work & pertinent test results  Airway Mallampati: I  TM Distance: >3 FB     Dental  (+) Teeth Intact   Pulmonary neg pulmonary ROS,    breath sounds clear to auscultation       Cardiovascular negative cardio ROS   Rhythm:Regular Rate:Normal     Neuro/Psych negative neurological ROS  negative psych ROS   GI/Hepatic negative GI ROS, Neg liver ROS,   Endo/Other  negative endocrine ROS  Renal/GU negative Renal ROS     Musculoskeletal   Abdominal   Peds  Hematology   Anesthesia Other Findings   Reproductive/Obstetrics                             Anesthesia Physical Anesthesia Plan  ASA: I  Anesthesia Plan: MAC   Post-op Pain Management:    Induction: Intravenous  PONV Risk Score and Plan:   Airway Management Planned: Nasal Cannula  Additional Equipment:   Intra-op Plan:   Post-operative Plan:   Informed Consent: I have reviewed the patients History and Physical, chart, labs and discussed the procedure including the risks, benefits and alternatives for the proposed anesthesia with the patient or authorized representative who has indicated his/her understanding and acceptance.     Plan Discussed with:   Anesthesia Plan Comments:         Anesthesia Quick Evaluation

## 2017-01-30 NOTE — Anesthesia Procedure Notes (Signed)
Procedure Name: MAC Date/Time: 01/30/2017 8:41 AM Performed by: Vista Deck Pre-anesthesia Checklist: Patient identified, Emergency Drugs available, Suction available, Timeout performed and Patient being monitored Patient Re-evaluated:Patient Re-evaluated prior to induction Oxygen Delivery Method: Nasal Cannula

## 2017-01-30 NOTE — Anesthesia Postprocedure Evaluation (Signed)
Anesthesia Post Note  Patient: Stacy Chase  Procedure(s) Performed: Procedure(s) (LRB): CATARACT EXTRACTION PHACO AND INTRAOCULAR LENS PLACEMENT (IOC) (Left)  Patient location during evaluation: PACU Anesthesia Type: MAC Level of consciousness: awake and alert Pain management: satisfactory to patient Vital Signs Assessment: post-procedure vital signs reviewed and stable Respiratory status: spontaneous breathing Cardiovascular status: stable Postop Assessment: no signs of nausea or vomiting Anesthetic complications: no     Last Vitals:  Vitals:   01/30/17 0825 01/30/17 0827  BP:    Resp: 10   Temp:  36.7 C    Last Pain:  Vitals:   01/30/17 0827  TempSrc: Oral                 Adhira Jamil

## 2017-01-30 NOTE — Op Note (Signed)
Patient brought to the operating room and prepped and draped in the usual manner.  Lid speculum inserted in left eye.  Stab incision made at the twelve o'clock position.  Provisc instilled in the anterior chamber.   A 2.4 mm. Stab incision was made temporally.  An anterior capsulotomy was done with a bent 25 gauge needle.  The nucleus was hydrodissected.  The Phaco tip was inserted in the anterior chamber and the nucleus was emulsified.  CDE was 14.94.  The cortical material was then removed with the I and A tip.  Posterior capsule was the polished.  The anterior chamber was deepened with Provisc.  A 22.5 Diopter Alcon AU00T0 IOL was then inserted in the capsular bag.  Provisc was then removed with the I and A tip.  The wound was then hydrated.  Patient sent to the Recovery Room in good condition with follow up in my office.  Preoperative Diagnosis:  Nuclear Cataract OS Postoperative Diagnosis:  Same Procedure name: Kelman Phacoemulsification OS with IOL

## 2017-01-31 ENCOUNTER — Encounter (HOSPITAL_COMMUNITY): Payer: Self-pay | Admitting: Ophthalmology

## 2017-02-26 ENCOUNTER — Other Ambulatory Visit (HOSPITAL_COMMUNITY): Payer: Self-pay | Admitting: Internal Medicine

## 2017-02-26 DIAGNOSIS — Z1231 Encounter for screening mammogram for malignant neoplasm of breast: Secondary | ICD-10-CM

## 2017-02-27 ENCOUNTER — Encounter (HOSPITAL_COMMUNITY)
Admission: RE | Admit: 2017-02-27 | Discharge: 2017-02-27 | Disposition: A | Discharge status: Home / Self Care | Payer: Medicare Other | Source: Ambulatory Visit | Attending: Ophthalmology | Admitting: Ophthalmology

## 2017-02-27 ENCOUNTER — Encounter (HOSPITAL_COMMUNITY): Payer: Self-pay

## 2017-03-06 ENCOUNTER — Ambulatory Visit (HOSPITAL_COMMUNITY): Payer: Medicare Other | Admitting: Anesthesiology

## 2017-03-06 ENCOUNTER — Encounter (HOSPITAL_COMMUNITY)
Admission: RE | Disposition: A | Discharge status: Home / Self Care | Payer: Self-pay | Source: Ambulatory Visit | Attending: Ophthalmology

## 2017-03-06 ENCOUNTER — Encounter (HOSPITAL_COMMUNITY): Payer: Self-pay | Admitting: *Deleted

## 2017-03-06 ENCOUNTER — Ambulatory Visit (HOSPITAL_COMMUNITY)
Admission: RE | Admit: 2017-03-06 | Discharge: 2017-03-06 | Disposition: A | Discharge status: Home / Self Care | Payer: Medicare Other | Source: Ambulatory Visit | Attending: Ophthalmology | Admitting: Ophthalmology

## 2017-03-06 DIAGNOSIS — H269 Unspecified cataract: Secondary | ICD-10-CM | POA: Diagnosis not present

## 2017-03-06 DIAGNOSIS — H2511 Age-related nuclear cataract, right eye: Secondary | ICD-10-CM | POA: Diagnosis not present

## 2017-03-06 DIAGNOSIS — Z888 Allergy status to other drugs, medicaments and biological substances status: Secondary | ICD-10-CM | POA: Insufficient documentation

## 2017-03-06 HISTORY — PX: CATARACT EXTRACTION W/PHACO: SHX586

## 2017-03-06 SURGERY — PHACOEMULSIFICATION, CATARACT, WITH IOL INSERTION
Anesthesia: Monitor Anesthesia Care | Site: Eye | Laterality: Right

## 2017-03-06 MED ORDER — TETRACAINE 0.5 % OP SOLN OPTIME - NO CHARGE
OPHTHALMIC | Status: DC | PRN
  Administered 2017-03-06: 2 [drp] via OPHTHALMIC

## 2017-03-06 MED ORDER — LACTATED RINGERS IV SOLN
INTRAVENOUS | Status: DC
  Administered 2017-03-06: 09:00:00 via INTRAVENOUS

## 2017-03-06 MED ORDER — PROVISC 10 MG/ML IO SOLN
INTRAOCULAR | Status: DC | PRN
  Administered 2017-03-06: 0.85 mL via INTRAOCULAR

## 2017-03-06 MED ORDER — CYCLOPENTOLATE-PHENYLEPHRINE 0.2-1 % OP SOLN
1.0000 [drp] | OPHTHALMIC | Status: AC
  Administered 2017-03-06 (×3): 1 [drp] via OPHTHALMIC

## 2017-03-06 MED ORDER — BSS IO SOLN
INTRAOCULAR | Status: DC | PRN
  Administered 2017-03-06: 15 mL

## 2017-03-06 MED ORDER — PHENYLEPHRINE HCL 2.5 % OP SOLN
1.0000 [drp] | OPHTHALMIC | Status: AC
  Administered 2017-03-06 (×3): 1 [drp] via OPHTHALMIC

## 2017-03-06 MED ORDER — KETOROLAC TROMETHAMINE 0.5 % OP SOLN
1.0000 [drp] | OPHTHALMIC | Status: AC
  Administered 2017-03-06 (×3): 1 [drp] via OPHTHALMIC

## 2017-03-06 MED ORDER — EPINEPHRINE PF 1 MG/ML IJ SOLN
INTRAOCULAR | Status: DC | PRN
  Administered 2017-03-06: 500 mL

## 2017-03-06 MED ORDER — TETRACAINE HCL 0.5 % OP SOLN
1.0000 [drp] | OPHTHALMIC | Status: AC
  Administered 2017-03-06 (×3): 1 [drp] via OPHTHALMIC

## 2017-03-06 SURGICAL SUPPLY — 11 items
CLOTH BEACON ORANGE TIMEOUT ST (SAFETY) ×3 IMPLANT
EYE SHIELD UNIVERSAL CLEAR (GAUZE/BANDAGES/DRESSINGS) ×3 IMPLANT
GLOVE BIO SURGEON STRL SZ 6.5 (GLOVE) ×2 IMPLANT
GLOVE BIO SURGEONS STRL SZ 6.5 (GLOVE) ×1
GLOVE BIOGEL PI IND STRL 7.0 (GLOVE) ×1 IMPLANT
GLOVE BIOGEL PI INDICATOR 7.0 (GLOVE) ×2
LENS ALC ACRYL/TECN (Ophthalmic Related) ×3 IMPLANT
PAD ARMBOARD 7.5X6 YLW CONV (MISCELLANEOUS) ×3 IMPLANT
TAPE SURG TRANSPORE 1 IN (GAUZE/BANDAGES/DRESSINGS) ×1 IMPLANT
TAPE SURGICAL TRANSPORE 1 IN (GAUZE/BANDAGES/DRESSINGS) ×2
WATER STERILE IRR 250ML POUR (IV SOLUTION) ×3 IMPLANT

## 2017-03-06 NOTE — Anesthesia Preprocedure Evaluation (Signed)
Anesthesia Evaluation  Patient identified by MRN, date of birth, ID band Patient awake    Airway Mallampati: I       Dental no notable dental hx. (+) Teeth Intact   Pulmonary neg pulmonary ROS,    Pulmonary exam normal breath sounds clear to auscultation       Cardiovascular negative cardio ROS Normal cardiovascular exam Rhythm:Regular Rate:Normal     Neuro/Psych negative neurological ROS  negative psych ROS   GI/Hepatic negative GI ROS, Neg liver ROS,   Endo/Other  negative endocrine ROS  Renal/GU negative Renal ROS  negative genitourinary   Musculoskeletal negative musculoskeletal ROS (+)   Abdominal   Peds  Hematology negative hematology ROS (+)   Anesthesia Other Findings   Reproductive/Obstetrics negative OB ROS                             Anesthesia Physical Anesthesia Plan  ASA: II  Anesthesia Plan: MAC   Post-op Pain Management:    Induction: Intravenous  PONV Risk Score and Plan: Ondansetron  Airway Management Planned: Nasal Cannula and Natural Airway  Additional Equipment:   Intra-op Plan:   Post-operative Plan:   Informed Consent: I have reviewed the patients History and Physical, chart, labs and discussed the procedure including the risks, benefits and alternatives for the proposed anesthesia with the patient or authorized representative who has indicated his/her understanding and acceptance.   Dental advisory given  Plan Discussed with: CRNA  Anesthesia Plan Comments:         Anesthesia Quick Evaluation

## 2017-03-06 NOTE — Discharge Instructions (Signed)
°  °        [facility] Instructions °[address]- [place] [address] °    ° °1. Avoid closing eyes tightly. One often closes the eye tightly when laughing, talking, sneezing, coughing or if they feel irritated. At these times, you should be careful not to close your eyes tightly. ° °2. Instill eye drops as instructed. To instill drops in your eye, open it, look up and have someone gently pull the lower lid down and instill a couple of drops inside the lower lid. ° °3. Do not touch upper lid. ° °4. Take Advil or Tylenol for pain. ° °5. You may use either eye for near work, such as reading or sewing and you may watch television. ° °6. You may have your hair done at the beauty parlor at any time. ° °7. Wear dark glasses with or without your own glasses if you are in bright light. ° °8. Call our office at [phone] or [phone] if you have sharp pain in your eye or unusual symptoms. ° °9.  FOLLOW UP WITH DR. SHAPIRO TODAY IN HIS [place] OFFICE AT 2:45pm. ° °  °I have received a copy of the above instructions and will follow them.  ° ° ° °IF YOU ARE IN IMMEDIATE DANGER CALL 911! ° °It is important for you to keep your follow-up appointment with your physician after discharge, OR, for you /your caregiver to make a follow-up appointment with your physician / medical provider after discharge. ° °Show these instructions to the next healthcare provider you see. ° ° °Moderate Conscious Sedation, Adult, Care After °These instructions provide you with information about caring for yourself after your procedure. Your health care provider may also give you more specific instructions. Your treatment has been planned according to current medical practices, but problems sometimes occur. Call your health care provider if you have any problems or questions after your procedure. °What can I expect after the procedure? °After your procedure, it is common: °· To feel sleepy for several  hours. °· To feel clumsy and have poor balance for several hours. °· To have poor judgment for several hours. °· To vomit if you eat too soon. ° °Follow these instructions at home: °For at least 24 hours after the procedure: ° °· Do not: °? Participate in activities where you could fall or become injured. °? Drive. °? Use heavy machinery. °? Drink alcohol. °? Take sleeping pills or medicines that cause drowsiness. °? Make important decisions or sign legal documents. °? Take care of children on your own. °· Rest. °Eating and drinking °· Follow the diet recommended by your health care provider. °· If you vomit: °? Drink water, juice, or soup when you can drink without vomiting. °? Make sure you have little or no nausea before eating solid foods. °General instructions °· Have a responsible adult stay with you until you are awake and alert. °· Take over-the-counter and prescription medicines only as told by your health care provider. °· If you smoke, do not smoke without supervision. °· Keep all follow-up visits as told by your health care provider. This is important. °Contact a health care provider if: °· You keep feeling nauseous or you keep vomiting. °· You feel light-headed. °· You develop a rash. °· You have a fever. °Get help right away if: °· You have trouble breathing. °This information is not intended to replace advice given to you by your health care provider. Make sure you   discuss any questions you have with your health care provider. °Document Released: 04/16/2013 Document Revised: 11/29/2015 Document Reviewed: 10/16/2015 °Elsevier Interactive Patient Education © 2018 [employer] ° ° °

## 2017-03-06 NOTE — Anesthesia Postprocedure Evaluation (Signed)
Anesthesia Post Note  Patient: Stacy Chase  Procedure(s) Performed: Procedure(s) (LRB): CATARACT EXTRACTION PHACO AND INTRAOCULAR LENS PLACEMENT (IOC) (Right)  Patient location during evaluation: Short Stay Anesthesia Type: MAC Level of consciousness: awake and alert, oriented and patient cooperative Pain management: pain level controlled Vital Signs Assessment: post-procedure vital signs reviewed and stable Respiratory status: spontaneous breathing, nonlabored ventilation and respiratory function stable Cardiovascular status: blood pressure returned to baseline Postop Assessment: no signs of nausea or vomiting Anesthetic complications: no     Last Vitals:  Vitals:   03/06/17 0836 03/06/17 0840  BP:  (!) 173/74  Resp:  13  Temp: 36.6 C   SpO2:  100%    Last Pain:  Vitals:   03/06/17 0836  TempSrc: Oral                 Voncile Schwarz J

## 2017-03-06 NOTE — H&P (Signed)
The patient was re examined and there is no change in the patients condition since the original H and P. 

## 2017-03-06 NOTE — Op Note (Signed)
Patient brought to the operating room and prepped and draped in the usual manner.  Lid speculum inserted in right eye.  Stab incision made at the twelve o'clock position.  Provisc instilled in the anterior chamber.   A 2.4 mm. Stab incision was made temporally.  An anterior capsulotomy was done with a bent 25 gauge needle.  The nucleus was hydrodissected.  The Phaco tip was inserted in the anterior chamber and the nucleus was emulsified.  CDE was 5.93.  The cortical material was then removed with the I and A tip.  Posterior capsule was the polished.  The anterior chamber was deepened with Provisc.  A 22.5 Diopter Alcon AU00T0 IOL was then inserted in the capsular bag.  Provisc was then removed with the I and A tip.  The wound was then hydrated.  Patient sent to the Recovery Room in good condition with follow up in my office.  Preoperative Diagnosis:  Nuclear Cataract OD Postoperative Diagnosis:  Same Procedure name: Kelman Phacoemulsification OD with IOL

## 2017-03-06 NOTE — Transfer of Care (Signed)
Immediate Anesthesia Transfer of Care Note  Patient: Stacy Chase  Procedure(s) Performed: Procedure(s) with comments: CATARACT EXTRACTION PHACO AND INTRAOCULAR LENS PLACEMENT (IOC) (Right) - CDE: 5.93  Patient Location: Short Stay  Anesthesia Type:MAC  Level of Consciousness: awake and patient cooperative  Airway & Oxygen Therapy: Patient Spontanous Breathing  Post-op Assessment: Report given to RN and Post -op Vital signs reviewed and stable  Post vital signs: Reviewed and stable  Last Vitals:  Vitals:   03/06/17 0836 03/06/17 0840  BP:  (!) 173/74  Resp:  13  Temp: 36.6 C   SpO2:  100%    Last Pain:  Vitals:   03/06/17 0836  TempSrc: Oral      Patients Stated Pain Goal: 8 (01/64/29 0379)  Complications: No apparent anesthesia complications

## 2017-03-07 ENCOUNTER — Encounter (HOSPITAL_COMMUNITY): Payer: Self-pay | Admitting: Ophthalmology

## 2017-04-02 ENCOUNTER — Ambulatory Visit (HOSPITAL_COMMUNITY)
Admission: RE | Admit: 2017-04-02 | Discharge: 2017-04-02 | Disposition: A | Discharge status: Home / Self Care | Payer: Medicare Other | Source: Ambulatory Visit | Attending: Internal Medicine | Admitting: Internal Medicine

## 2017-04-02 DIAGNOSIS — Z1231 Encounter for screening mammogram for malignant neoplasm of breast: Secondary | ICD-10-CM | POA: Insufficient documentation

## 2017-06-26 DIAGNOSIS — Z961 Presence of intraocular lens: Secondary | ICD-10-CM | POA: Diagnosis not present

## 2017-09-26 DIAGNOSIS — K219 Gastro-esophageal reflux disease without esophagitis: Secondary | ICD-10-CM | POA: Diagnosis not present

## 2017-09-26 DIAGNOSIS — E785 Hyperlipidemia, unspecified: Secondary | ICD-10-CM | POA: Diagnosis not present

## 2017-09-26 DIAGNOSIS — Z79899 Other long term (current) drug therapy: Secondary | ICD-10-CM | POA: Diagnosis not present

## 2017-10-03 DIAGNOSIS — Z23 Encounter for immunization: Secondary | ICD-10-CM | POA: Diagnosis not present

## 2017-10-03 DIAGNOSIS — E785 Hyperlipidemia, unspecified: Secondary | ICD-10-CM | POA: Diagnosis not present

## 2017-10-03 DIAGNOSIS — Z681 Body mass index (BMI) 19 or less, adult: Secondary | ICD-10-CM | POA: Diagnosis not present

## 2017-10-05 ENCOUNTER — Other Ambulatory Visit (HOSPITAL_COMMUNITY): Payer: Self-pay | Admitting: Internal Medicine

## 2017-10-05 DIAGNOSIS — Z780 Asymptomatic menopausal state: Secondary | ICD-10-CM

## 2017-10-15 ENCOUNTER — Ambulatory Visit (HOSPITAL_COMMUNITY)
Admission: RE | Admit: 2017-10-15 | Discharge: 2017-10-15 | Disposition: A | Discharge status: Home / Self Care | Payer: Medicare Other | Source: Ambulatory Visit | Attending: Internal Medicine | Admitting: Internal Medicine

## 2017-10-15 DIAGNOSIS — M85851 Other specified disorders of bone density and structure, right thigh: Secondary | ICD-10-CM | POA: Insufficient documentation

## 2017-10-15 DIAGNOSIS — M8588 Other specified disorders of bone density and structure, other site: Secondary | ICD-10-CM | POA: Insufficient documentation

## 2017-10-15 DIAGNOSIS — Z780 Asymptomatic menopausal state: Secondary | ICD-10-CM | POA: Diagnosis not present

## 2017-10-15 DIAGNOSIS — M8589 Other specified disorders of bone density and structure, multiple sites: Secondary | ICD-10-CM | POA: Diagnosis not present

## 2017-12-08 ENCOUNTER — Other Ambulatory Visit: Payer: Self-pay

## 2017-12-08 ENCOUNTER — Emergency Department (HOSPITAL_COMMUNITY)
Admission: EM | Admit: 2017-12-08 | Discharge: 2017-12-08 | Disposition: A | Discharge status: Home / Self Care | Payer: Medicare Other | Attending: Emergency Medicine | Admitting: Emergency Medicine

## 2017-12-08 ENCOUNTER — Encounter (HOSPITAL_COMMUNITY): Payer: Self-pay | Admitting: Emergency Medicine

## 2017-12-08 DIAGNOSIS — L299 Pruritus, unspecified: Secondary | ICD-10-CM | POA: Diagnosis not present

## 2017-12-08 DIAGNOSIS — W57XXXA Bitten or stung by nonvenomous insect and other nonvenomous arthropods, initial encounter: Secondary | ICD-10-CM | POA: Insufficient documentation

## 2017-12-08 DIAGNOSIS — Y939 Activity, unspecified: Secondary | ICD-10-CM | POA: Diagnosis not present

## 2017-12-08 DIAGNOSIS — Y999 Unspecified external cause status: Secondary | ICD-10-CM | POA: Diagnosis not present

## 2017-12-08 DIAGNOSIS — Y929 Unspecified place or not applicable: Secondary | ICD-10-CM | POA: Diagnosis not present

## 2017-12-08 DIAGNOSIS — S80862A Insect bite (nonvenomous), left lower leg, initial encounter: Secondary | ICD-10-CM | POA: Diagnosis not present

## 2017-12-08 MED ORDER — TRIAMCINOLONE ACETONIDE 0.1 % EX CREA: 1.0000 "application " | TOPICAL_CREAM | Freq: Three times a day (TID) | CUTANEOUS | 0 refills | Status: DC

## 2017-12-08 NOTE — ED Provider Notes (Signed)
[facility] EMERGENCY DEPARTMENT Provider Note   CSN: [id number] Arrival date & time: 12/08/17  1317     History   Chief Complaint Chief Complaint  Patient presents with  . Tick Removal    HPI Stacy Chase is a 72 y.o. female.  HPI  Stacy Chase is a 72 y.o. female who presents to the Emergency Department complaining of itching to her left leg secondary to a tick bite.  She states that she removed a tick from her left lower leg yesterday and noticed redness and itching to the site this morning.  She has been cleaning the area with alcohol.  Tick was not engorged at the time of removal.  She denies fever, chills, arthralgias and rash.  Past Medical History:  Diagnosis Date  . Medical history non-contributory     There are no active problems to display for this patient.   Past Surgical History:  Procedure Laterality Date  . CATARACT EXTRACTION W/PHACO Left 01/30/2017   Procedure: CATARACT EXTRACTION PHACO AND INTRAOCULAR LENS PLACEMENT (IOC);  Surgeon: Rutherford Guys, MD;  Location: [facility] ORS;  Service: Ophthalmology;  Laterality: Left;  CDE: 14.94  . CATARACT EXTRACTION W/PHACO Right 03/06/2017   Procedure: CATARACT EXTRACTION PHACO AND INTRAOCULAR LENS PLACEMENT (IOC);  Surgeon: Rutherford Guys, MD;  Location: [facility] OR[facility];  Service: Ophthalmology;  Laterality: Right;  CDE: 5.93  . CERVICAL POLYPECTOMY    . COLONOSCOPY    . COLONOSCOPY N/A 06/10/2014   Procedure: COLONOSCOPY;  Surgeon: Rogene Houston, MD;  Location: [facility] ENDO SUITE;  Service: Endoscopy;  Laterality: N/A;  930     OB History   None      Home Medications    Prior to Admission medications   Medication Sig Start Date End Date Taking? Authorizing Provider  triamcinolone cream (KENALOG) 0.1 % Apply 1 application topically 3 (three) times daily. 12/08/17   Kem Parkinson, PA-C    Family History Family History  Problem Relation Age of Onset  . Hypertension Mother   . Diabetes Sister   . Cancer Sister        breast    . Hypertension Sister   . Cancer Brother        throat  . Hypertension Maternal Grandmother   . Hypertension Maternal Grandfather   . Diabetes Sister     Social History Social History   Tobacco Use  . Smoking status: Never Smoker  . Smokeless tobacco: Never Used  Substance Use Topics  . Alcohol use: No  . Drug use: No     Allergies   Cortisone and Morphine and related   Review of Systems Review of Systems  Constitutional: Negative for chills and fever.  Gastrointestinal: Negative for abdominal pain, nausea and vomiting.  Musculoskeletal: Negative for arthralgias and myalgias.  Skin:       Tick bite  Neurological: Negative for dizziness, weakness and numbness.     Physical Exam Updated Vital Signs BP 138/65 (BP Location: Right Arm)   Pulse 74   Temp 97.7 F (36.5 C) (Oral)   Resp 15   Ht 5\' 9"  (1.753 m)   Wt 59.9 kg (132 lb)   SpO2 100%   BMI 19.49 kg/m   Physical Exam  Constitutional: She is oriented to person, place, and time. She appears well-developed and well-nourished. No distress.  Cardiovascular: Normal rate and regular rhythm.  Pulmonary/Chest: Effort normal. No respiratory distress.  Musculoskeletal: Normal range of motion.  Neurological: She is alert and oriented to  person, place, and time. No sensory deficit.  Skin: Skin is warm.  1 cm erythematous papule to the posterior left lower leg.  No surrounding erythema.  No target lesions.  No remaining tick parts seen.  Psychiatric: She has a normal mood and affect.  Nursing note and vitals reviewed.    ED Treatments / Results  Labs (all labs ordered are listed, but only abnormal results are displayed) Labs Reviewed - No data to display  EKG None  Radiology No results found.  Procedures Procedures (including critical care time)  Medications Ordered in ED Medications - No data to display   Initial Impression / Assessment and Plan / ED Course  I have reviewed the triage vital signs  and the nursing notes.  Pertinent labs & imaging results that were available during my care of the patient were reviewed by me and considered in my medical decision making (see chart for details).     Patient with tick bite, no associated symptoms.  Take removed prior to arrival.  treatment with steroid cream and over-the-counter Benadryl if needed for itching.  Patient advised of symptoms associated with tick related illness and patient agrees to close follow-up with PCP if needed  Final Clinical Impressions(s) / ED Diagnoses   Final diagnoses:  Tick bite, initial encounter    ED Discharge Orders        Ordered    triamcinolone cream (KENALOG) 0.1 %  3 times daily     12/08/17 Marshall, [place], PA-C 12/08/17 1524    Francine Graven, DO 12/09/17 1123

## 2017-12-08 NOTE — Discharge Instructions (Signed)
You may clean the area with mild soap and water and apply the cream as directed.  You may take over-the-counter Benadryl 1 capsule every 6 hours if needed for itching.  Follow-up with your primary doctor or return to the ER for any worsening symptoms such as fever rash, joint pains, or flulike symptoms.

## 2017-12-08 NOTE — ED Triage Notes (Signed)
Patient complaining of tick bite yesterday to back of left leg. Only complaint is itching to area.

## 2018-01-30 ENCOUNTER — Emergency Department (HOSPITAL_COMMUNITY): Payer: No Typology Code available for payment source

## 2018-01-30 ENCOUNTER — Other Ambulatory Visit: Payer: Self-pay

## 2018-01-30 ENCOUNTER — Emergency Department (HOSPITAL_COMMUNITY)
Admission: EM | Admit: 2018-01-30 | Discharge: 2018-01-30 | Disposition: A | Discharge status: Home / Self Care | Payer: No Typology Code available for payment source | Attending: Emergency Medicine | Admitting: Emergency Medicine

## 2018-01-30 ENCOUNTER — Encounter (HOSPITAL_COMMUNITY): Payer: Self-pay | Admitting: Emergency Medicine

## 2018-01-30 DIAGNOSIS — S199XXA Unspecified injury of neck, initial encounter: Secondary | ICD-10-CM | POA: Diagnosis not present

## 2018-01-30 DIAGNOSIS — S0083XA Contusion of other part of head, initial encounter: Secondary | ICD-10-CM | POA: Diagnosis not present

## 2018-01-30 DIAGNOSIS — Y92413 State road as the place of occurrence of the external cause: Secondary | ICD-10-CM | POA: Diagnosis not present

## 2018-01-30 DIAGNOSIS — S0990XA Unspecified injury of head, initial encounter: Secondary | ICD-10-CM | POA: Diagnosis not present

## 2018-01-30 DIAGNOSIS — M542 Cervicalgia: Secondary | ICD-10-CM | POA: Insufficient documentation

## 2018-01-30 DIAGNOSIS — R0689 Other abnormalities of breathing: Secondary | ICD-10-CM | POA: Diagnosis not present

## 2018-01-30 DIAGNOSIS — Y998 Other external cause status: Secondary | ICD-10-CM | POA: Diagnosis not present

## 2018-01-30 DIAGNOSIS — Y9389 Activity, other specified: Secondary | ICD-10-CM | POA: Diagnosis not present

## 2018-01-30 DIAGNOSIS — S40012A Contusion of left shoulder, initial encounter: Secondary | ICD-10-CM | POA: Insufficient documentation

## 2018-01-30 DIAGNOSIS — S4992XA Unspecified injury of left shoulder and upper arm, initial encounter: Secondary | ICD-10-CM | POA: Diagnosis not present

## 2018-01-30 DIAGNOSIS — M25512 Pain in left shoulder: Secondary | ICD-10-CM | POA: Diagnosis not present

## 2018-01-30 DIAGNOSIS — I10 Essential (primary) hypertension: Secondary | ICD-10-CM | POA: Diagnosis not present

## 2018-01-30 DIAGNOSIS — R51 Headache: Secondary | ICD-10-CM | POA: Diagnosis not present

## 2018-01-30 NOTE — ED Notes (Signed)
Pt complaining of left facial pain as well as left shoulder pain.
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
## 2018-01-30 NOTE — Discharge Instructions (Addendum)
Your blood pressure is slightly above the normal, otherwise your vital signs are within normal limits.  Your oxygen level is 100% on room air.  Within normal limits by my interpretation.  You can expect to be sore over the next few days.  Please use Tylenol extra strength every 4 hours.  Please see your primary physician or return to the emergency department if any changes, problems, or concerns.

## 2018-02-06 DIAGNOSIS — S0083XA Contusion of other part of head, initial encounter: Secondary | ICD-10-CM | POA: Diagnosis not present

## 2018-02-20 DIAGNOSIS — S134XXA Sprain of ligaments of cervical spine, initial encounter: Secondary | ICD-10-CM | POA: Diagnosis not present

## 2018-02-25 ENCOUNTER — Other Ambulatory Visit (HOSPITAL_COMMUNITY): Payer: Self-pay | Admitting: Internal Medicine

## 2018-02-25 DIAGNOSIS — Z1231 Encounter for screening mammogram for malignant neoplasm of breast: Secondary | ICD-10-CM

## 2018-03-07 DIAGNOSIS — S161XXD Strain of muscle, fascia and tendon at neck level, subsequent encounter: Secondary | ICD-10-CM | POA: Diagnosis not present

## 2018-03-21 ENCOUNTER — Other Ambulatory Visit: Payer: Self-pay

## 2018-03-21 ENCOUNTER — Ambulatory Visit (HOSPITAL_COMMUNITY): Payer: Medicare Other | Attending: Internal Medicine | Admitting: Occupational Therapy

## 2018-03-21 ENCOUNTER — Encounter (HOSPITAL_COMMUNITY): Payer: Self-pay | Admitting: Occupational Therapy

## 2018-03-21 DIAGNOSIS — M542 Cervicalgia: Secondary | ICD-10-CM | POA: Diagnosis not present

## 2018-03-21 DIAGNOSIS — M25512 Pain in left shoulder: Secondary | ICD-10-CM | POA: Insufficient documentation

## 2018-03-21 DIAGNOSIS — G8929 Other chronic pain: Secondary | ICD-10-CM | POA: Diagnosis not present

## 2018-03-21 DIAGNOSIS — R29898 Other symptoms and signs involving the musculoskeletal system: Secondary | ICD-10-CM | POA: Diagnosis not present

## 2018-03-21 NOTE — Patient Instructions (Signed)
1) Seated Row   Sit up straight with elbows by your sides. Pull back with shoulders/elbows, keeping forearms straight, as if pulling back on the reins of a horse. Squeeze shoulder blades together. Repeat _10__times, __2__sets/day    2) Shoulder Elevation    Sit up straight with arms by your sides. Slowly bring your shoulders up towards your ears. Repeat_10__times, _2__ sets/day    3) Shoulder Extension    Sit up straight with both arms by your side, draw your arms back behind your waist. Keep your elbows straight. Repeat ___10_times, _2___sets/day.       AROM: Lateral Neck Flexion   Slowly tilt head toward one shoulder, then the other. Hold each position _5___ seconds. Repeat __10__ times per set. Do __1__ sets per session. Do _2___ sessions per day.  http://orth.exer.us/296   Copyright  VHI. All rights reserved.  AROM: Neck Extension   Bend head backward. Hold __5__ seconds. Repeat _10___ times per set. Do ___1_ sets per session. Do __2__ sessions per day.  http://orth.exer.us/300   Copyright  VHI. All rights reserved.  AROM: Neck Flexion   Bend head forward. Hold _5___ seconds. Repeat __10__ times per set. Do __1__ sets per session. Do 2__ sessions per day.  http://orth.exer.us/298   Copyright  VHI. All rights reserved.  AROM: Neck Rotation   Turn head slowly to look over one shoulder, then the other. Hold each position __5__ seconds. Repeat _10___ times per set. Do __1__ sets per session. Do __2__ sessions per day.  http://orth.exer.us/294   Copyright  VHI. All rights reserved.
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
## 2018-03-26 ENCOUNTER — Telehealth (HOSPITAL_COMMUNITY): Payer: Self-pay | Admitting: Internal Medicine

## 2018-03-26 NOTE — Telephone Encounter (Signed)
03/26/18  Left patient a message to offer an appointment on Wed. 9/18 at 3:15 with Mickel Baas since she was on our wait list

## 2018-03-29 ENCOUNTER — Encounter

## 2018-04-03 ENCOUNTER — Ambulatory Visit (HOSPITAL_COMMUNITY): Payer: Medicare Other

## 2018-04-03 ENCOUNTER — Encounter (HOSPITAL_COMMUNITY): Payer: Self-pay

## 2018-04-03 DIAGNOSIS — M25512 Pain in left shoulder: Secondary | ICD-10-CM

## 2018-04-03 DIAGNOSIS — M542 Cervicalgia: Secondary | ICD-10-CM | POA: Diagnosis not present

## 2018-04-03 DIAGNOSIS — R29898 Other symptoms and signs involving the musculoskeletal system: Secondary | ICD-10-CM | POA: Diagnosis not present

## 2018-04-03 NOTE — Therapy (Signed)
[place], [place], [address] Phone: [phone]   Fax:  [phone]  Occupational Therapy Treatment  Patient Details  Name: PELAGIA Chase MRN: [id number] Date of Birth: 04/25/1946 Referring Provider: Asencion Noble   Encounter Date: 04/03/2018  OT End of Session - 04/03/18 1551    Visit Number  2    Number of Visits  16    Date for OT Re-Evaluation  04/20/18    Authorization Type  1) Medicare Part A 2)[employer]    OT Start Time  1520    OT Stop Time  1600    OT Time Calculation (min)  40 min    Activity Tolerance  Patient tolerated treatment well    Behavior During Therapy  [facility] for tasks assessed/performed       Past Medical History:  Diagnosis Date  . Medical history non-contributory     Past Surgical History:  Procedure Laterality Date  . CATARACT EXTRACTION W/PHACO Left 01/30/2017   Procedure: CATARACT EXTRACTION PHACO AND INTRAOCULAR LENS PLACEMENT (IOC);  Surgeon: Rutherford Guys, MD;  Location: [facility];  Service: Ophthalmology;  Laterality: Left;  CDE: 14.94  . CATARACT EXTRACTION W/PHACO Right 03/06/2017   Procedure: CATARACT EXTRACTION PHACO AND INTRAOCULAR LENS PLACEMENT (IOC);  Surgeon: Rutherford Guys, MD;  Location: [facility];  Service: Ophthalmology;  Laterality: Right;  CDE: 5.93  . CERVICAL POLYPECTOMY    . COLONOSCOPY    . COLONOSCOPY N/A 06/10/2014   Procedure: COLONOSCOPY;  Surgeon: Rogene Houston, MD;  [facility];  Service: Endoscopy;  Laterality: N/A;  930    There were no vitals filed for this visit.  Subjective Assessment - 04/03/18 1538    Subjective   S: It feels a lot better than it did.     Currently in Pain?  No/denies         [facility] OT Assessment - 04/03/18 1538      Assessment   Medical Diagnosis  Left Shoulder pain      Precautions   Precautions  None               OT Treatments/Exercises (OP) - 04/03/18 1538      Exercises   Exercises   Shoulder      Shoulder Exercises: Supine   Protraction  PROM;5 reps    Horizontal ABduction  PROM;5 reps    External Rotation  PROM;5 reps    Internal Rotation  PROM;5 reps    Flexion  PROM;5 reps    ABduction  PROM;5 reps      Shoulder Exercises: Standing   Protraction  AAROM;10 reps    Horizontal ABduction  AROM;10 reps    External Rotation  AROM;10 reps    Internal Rotation  AROM;10 reps    Flexion  Strengthening;10 reps    Shoulder Flexion Weight (lbs)  1    ABduction  AAROM;10 reps      Shoulder Exercises: ROM/Strengthening   Wall Wash  1'      Manual Therapy   Manual Therapy  Soft tissue mobilization    Manual therapy comments  Manual therapy completed prior to exercises.     Soft tissue mobilization  Myofasical release and manual stretching completed to left upper arm, trapezius, scapularis, and cervical region to decrease fascial restrictions and increase joint mobility in a pain free zone.                OT Short Term Goals -  04/03/18 1542      OT SHORT TERM GOAL #1   Title  Patient will be educated on and become independent using HEP to be able to use LUE during ADLs.       Time  4    Period  Weeks    Status  On-going      OT SHORT TERM GOAL #2   Title  Patient will decrease pain in LUE to 2/10 or less to be able to use LUE during food preparation.    Time  4    Period  Weeks    Status  On-going      OT SHORT TERM GOAL #3   Title  Pt will improve cervical A/ROM to improve ability to turn head while driving.        Time  4    Period  Weeks    Status  On-going      OT SHORT TERM GOAL #4   Title  Pt will increase LUE strength to at least 4+/5 to improve ability to carry items during yard work tasks.       Time  4    Period  Weeks    Status  On-going      OT SHORT TERM GOAL #5   Title  Pt will decrease fascial restrictions in LUE to minimal amounts or less to improve mobility required for functional reaching tasks.    Time  4    Period  Weeks     Status  On-going      OT SHORT TERM GOAL #6   Title  Pt will improve left shoulder A/ROM to improve ability to reach overhead while putting away groceries.     Time  4    Period  Weeks    Status  On-going           Plan - 04/03/18 1551    Clinical Impression Statement  A: Initiated myofascial release, manual stretching, some AA/ROM and A/ROM exercies. patient presents with muscle fatigue during session which required additional exercise modifications. HEP was not updated at this session but may need to be next session. Patient has full passive and active shoulder ROM. Cervical flexion and extension are WNL. VC for form and technique as needed.     Plan  P: Complete A/ROM supine. Update HEP if able to. Attempt scapular strengthening with red band.    Consulted and Agree with Plan of Care  Patient       Patient will benefit from skilled therapeutic intervention in order to improve the following deficits and impairments:  Impaired flexibility, Pain, Decreased mobility, Decreased activity tolerance, Decreased range of motion, Decreased strength, Impaired UE functional use  Visit Diagnosis: Acute pain of left shoulder  Cervicalgia  Other symptoms and signs involving the musculoskeletal system    Problem List There are no active problems to display for this patient.  Stacy Chase, OTR/L,CBIS  [phone]  04/03/2018, 4:00 PM  [facility] [address] [place], [place], [address] Phone: [phone]   Fax:  [phone]  Name: Stacy Chase MRN: [id number] Date of Birth: 10-17-1945

## 2018-04-04 ENCOUNTER — Ambulatory Visit (HOSPITAL_COMMUNITY)
Admission: RE | Admit: 2018-04-04 | Discharge: 2018-04-04 | Disposition: A | Discharge status: Home / Self Care | Payer: Medicare Other | Source: Ambulatory Visit | Attending: Internal Medicine | Admitting: Internal Medicine

## 2018-04-04 DIAGNOSIS — Z1231 Encounter for screening mammogram for malignant neoplasm of breast: Secondary | ICD-10-CM

## 2018-04-05 ENCOUNTER — Ambulatory Visit (HOSPITAL_COMMUNITY): Payer: Medicare Other | Admitting: Occupational Therapy

## 2018-04-05 DIAGNOSIS — R29898 Other symptoms and signs involving the musculoskeletal system: Secondary | ICD-10-CM

## 2018-04-05 DIAGNOSIS — M542 Cervicalgia: Secondary | ICD-10-CM | POA: Diagnosis not present

## 2018-04-05 DIAGNOSIS — M25512 Pain in left shoulder: Secondary | ICD-10-CM

## 2018-04-05 NOTE — Patient Instructions (Signed)

## 2018-04-05 NOTE — Therapy (Addendum)
[facility], [place], [address] Phone: [phone]   Fax:  [phone]  Occupational Therapy Treatment  Patient Details  Name: Stacy Chase MRN: [id number] Date of Birth: [birth year] No data recorded  Encounter Date: 04/05/2018  OT End of Session - 04/05/18 1734    Visit Number  3    Number of Visits  16    Date for OT Re-Evaluation  04/20/18    Authorization Type  1) Medicare Part A 2)[employer]    OT Start Time  1648    OT Stop Time  1729    OT Time Calculation (min)  41 min    Activity Tolerance  Patient tolerated treatment well    Behavior During Therapy  [facility] for tasks assessed/performed       Past Medical History:  Diagnosis Date  . Medical history non-contributory     Past Surgical History:  Procedure Laterality Date  . CATARACT EXTRACTION W/PHACO Left 01/30/2017   Procedure: CATARACT EXTRACTION PHACO AND INTRAOCULAR LENS PLACEMENT (IOC);  Surgeon: Rutherford Guys, MD;  Location: [facility];  Service: Ophthalmology;  Laterality: Left;  CDE: 14.94  . CATARACT EXTRACTION W/PHACO Right 03/06/2017   Procedure: CATARACT EXTRACTION PHACO AND INTRAOCULAR LENS PLACEMENT (IOC);  Surgeon: Rutherford Guys, MD;  Location: [facility];  Service: Ophthalmology;  Laterality: Right;  CDE: 5.93  . CERVICAL POLYPECTOMY    . COLONOSCOPY    . COLONOSCOPY N/A 06/10/2014   Procedure: COLONOSCOPY;  Surgeon: Rogene Houston, MD;  [facility];  Service: Endoscopy;  Laterality: N/A;  930    There were no vitals filed for this visit.  Subjective Assessment - 04/05/18 1735    Subjective   S: My shoulder is feeling OK lately.     Currently in Pain?  No/denies         [facility] OT Assessment - 04/05/18 1650      Assessment   Medical Diagnosis  Left Shoulder pain      Precautions   Precautions  None               OT Treatments/Exercises (OP) - 04/05/18 1651      Exercises   Exercises  Shoulder       Shoulder Exercises: Supine   Protraction  PROM;5 reps;AROM;10 reps    Horizontal ABduction  PROM;5 reps;AROM;10 reps    External Rotation  PROM;5 reps;AROM;10 reps    Internal Rotation  PROM;5 reps;AROM;10 reps    Flexion  PROM;5 reps;AROM;10 reps    ABduction  PROM;5 reps;AROM;10 reps      Shoulder Exercises: Seated   Elevation  10 reps    Retraction  10 reps      Shoulder Exercises: Standing   Extension  10 reps;Theraband    Theraband Level (Shoulder Extension)  Level 2 (Red)    Row  10 reps;Theraband    Theraband Level (Shoulder Row)  Level 2 (Red)      Shoulder Exercises: Stretch   Corner Stretch  2 reps;Other (comment)   10 seconds   Cross Chest Stretch  2 reps;Other (comment)   10 seconds   Wall Stretch - Flexion  2 reps;Other (comment)   10 seconds   Wall Stretch - ABduction  2 reps;Other (comment)   10 seconds   Other Shoulder Stretches  --      Manual Therapy   Manual Therapy  Soft tissue mobilization    Manual therapy comments  Manual therapy  completed prior to exercises.     Soft tissue mobilization  Myofasical release and manual stretching completed to left upper arm, trapezius, scapularis, and cervical region to decrease fascial restrictions and increase joint mobility in a pain free zone.              OT Education - 04/05/18 1734    Education Details  Pt was provided with an HEP for shoulder stretches.    Person(s) Educated  Patient    Methods  Explanation;Demonstration;Verbal cues;Handout    Comprehension  Verbalized understanding;Returned demonstration       OT Short Term Goals - 04/03/18 1542      OT SHORT TERM GOAL #1   Title  Patient will be educated on and become independent using HEP to be able to use LUE during ADLs.       Time  4    Period  Weeks    Status  On-going      OT SHORT TERM GOAL #2   Title  Patient will decrease pain in LUE to 2/10 or less to be able to use LUE during food preparation.    Time  4    Period  Weeks     Status  On-going      OT SHORT TERM GOAL #3   Title  Pt will improve cervical A/ROM to improve ability to turn head while driving.        Time  4    Period  Weeks    Status  On-going      OT SHORT TERM GOAL #4   Title  Pt will increase LUE strength to at least 4+/5 to improve ability to carry items during yard work tasks.       Time  4    Period  Weeks    Status  On-going      OT SHORT TERM GOAL #5   Title  Pt will decrease fascial restrictions in LUE to minimal amounts or less to improve mobility required for functional reaching tasks.    Time  4    Period  Weeks    Status  On-going      OT SHORT TERM GOAL #6   Title  Pt will improve left shoulder A/ROM to improve ability to reach overhead while putting away groceries.     Time  4    Period  Weeks    Status  On-going               Plan - 04/05/18 1737    Clinical Impression Statement  A: Pt able to complete shoulder A/ROM in supine this session, although reports experiencing muscle fatigue after completion. HEP was updated this session and pt demonstrated and verbalized understanding of shoulder stretches. Added red scapular theraband this session. Pt's ROM WFL with pain noted at end range. Discontinued session when pt reported feeling light headed. Verbal cues for form and technique as needed.     Plan  P: Follow up on HEP and update for A/ROM, if appropriate. Continue with scapular strengthening using red theraband.  Discontinue A/ROM in supine and progress to A/ROM in standing.     OT Home Exercise Plan  Shoulder stretches HEP    Consulted and Agree with Plan of Care  Patient       Patient will benefit from skilled therapeutic intervention in order to improve the following deficits and impairments:  Impaired flexibility, Pain, Decreased mobility, Decreased activity tolerance, Decreased range of motion, Decreased  strength, Impaired UE functional use  Visit Diagnosis: Acute pain of left shoulder  Other symptoms and  signs involving the musculoskeletal system    Problem List There are no active problems to display for this patient.   Toney Rakes, OT Student  04/05/2018, 6:05 PM  [facility] [address] [place], [place], [address] Phone: [phone]   Fax:  [phone]  Name: EMILIA KAYES MRN: [id number] Date of Birth: [birth year]      This qualified practitioner was present in the room guiding the student in service delivery. Therapy student was participating in the provision of services, and the practitioner was not engaged in treating another patient or doing other tasks at the same time.   Guadelupe Sabin, OTR/L  [phone] 04/05/2018

## 2018-04-10 ENCOUNTER — Ambulatory Visit (HOSPITAL_COMMUNITY): Payer: Medicare Other | Attending: Internal Medicine

## 2018-04-10 ENCOUNTER — Encounter (HOSPITAL_COMMUNITY): Payer: Self-pay

## 2018-04-10 DIAGNOSIS — M25512 Pain in left shoulder: Secondary | ICD-10-CM | POA: Insufficient documentation

## 2018-04-10 DIAGNOSIS — M542 Cervicalgia: Secondary | ICD-10-CM

## 2018-04-10 DIAGNOSIS — R29898 Other symptoms and signs involving the musculoskeletal system: Secondary | ICD-10-CM | POA: Insufficient documentation

## 2018-04-10 NOTE — Therapy (Signed)
[place], [place], [address] Phone: [phone]   Fax:  [phone]  Occupational Therapy Treatment  Patient Details  Name: Stacy Chase MRN: [id number] Date of Birth: 1945/09/22 No data recorded  Encounter Date: 04/10/2018  OT End of Session - 04/10/18 1403    Visit Number  4    Number of Visits  16    Date for OT Re-Evaluation  04/20/18    Authorization Type  1) Medicare Part A 2)[employer]    OT Start Time  1350    OT Stop Time  1430    OT Time Calculation (min)  40 min    Activity Tolerance  Patient tolerated treatment well    Behavior During Therapy  [facility] for tasks assessed/performed       Past Medical History:  Diagnosis Date  . Medical history non-contributory     Past Surgical History:  Procedure Laterality Date  . CATARACT EXTRACTION W/PHACO Left 01/30/2017   Procedure: CATARACT EXTRACTION PHACO AND INTRAOCULAR LENS PLACEMENT (IOC);  Surgeon: Rutherford Guys, MD;  Location: [facility];  Service: Ophthalmology;  Laterality: Left;  CDE: 14.94  . CATARACT EXTRACTION W/PHACO Right 03/06/2017   Procedure: CATARACT EXTRACTION PHACO AND INTRAOCULAR LENS PLACEMENT (IOC);  Surgeon: Rutherford Guys, MD;  Location: [facility];  Service: Ophthalmology;  Laterality: Right;  CDE: 5.93  . CERVICAL POLYPECTOMY    . COLONOSCOPY    . COLONOSCOPY N/A 06/10/2014   Procedure: COLONOSCOPY;  Surgeon: Rogene Houston, MD;  Location: [facility];  Service: Endoscopy;  Laterality: N/A;  930    There were no vitals filed for this visit.  Subjective Assessment - 04/10/18 1351    Subjective   S: It's feeling a lot better.     Currently in Pain?  No/denies         [facility] OT Assessment - 04/10/18 1351      Assessment   Medical Diagnosis  Left Shoulder pain      Precautions   Precautions  None               OT Treatments/Exercises (OP) - 04/10/18 1351      Exercises   Exercises  Shoulder      Shoulder  Exercises: Supine   Protraction  PROM;5 reps    Horizontal ABduction  PROM;5 reps    External Rotation  PROM;5 reps    Internal Rotation  PROM;5 reps    Flexion  PROM;5 reps    ABduction  PROM;5 reps      Shoulder Exercises: Standing   Protraction  AROM;12 reps    Horizontal ABduction  AROM;12 reps    External Rotation  AROM;12 reps    Internal Rotation  AROM;12 reps    Flexion  AROM;12 reps    ABduction  AROM;12 reps    Extension  10 reps;Theraband    Theraband Level (Shoulder Extension)  Level 2 (Red)    Row  10 reps;Theraband    Theraband Level (Shoulder Row)  Level 2 (Red)      Shoulder Exercises: Therapy Ball   Other Therapy Ball Exercises  Green therapy ball; chest press flexion, circles 10X      Shoulder Exercises: ROM/Strengthening   UBE (Upper Arm Bike)  Level 1 2' forward 2' reverse    pace: 3.5-4.0   Proximal Shoulder Strengthening, Seated  10X no rest breaks    Other ROM/Strengthening Exercises  ABC's on door witth washcloth with shoulder  flexed at 90 degrees.       Manual Therapy   Manual Therapy  Soft tissue mobilization    Manual therapy comments  Manual therapy completed prior to exercises.     Soft tissue mobilization  Myofasical release and manual stretching completed to left upper arm, trapezius, scapularis, and cervical region to decrease fascial restrictions and increase joint mobility in a pain free zone.                OT Short Term Goals - 04/03/18 1542      OT SHORT TERM GOAL #1   Title  Patient will be educated on and become independent using HEP to be able to use LUE during ADLs.       Time  4    Period  Weeks    Status  On-going      OT SHORT TERM GOAL #2   Title  Patient will decrease pain in LUE to 2/10 or less to be able to use LUE during food preparation.    Time  4    Period  Weeks    Status  On-going      OT SHORT TERM GOAL #3   Title  Pt will improve cervical A/ROM to improve ability to turn head while driving.        Time   4    Period  Weeks    Status  On-going      OT SHORT TERM GOAL #4   Title  Pt will increase LUE strength to at least 4+/5 to improve ability to carry items during yard work tasks.       Time  4    Period  Weeks    Status  On-going      OT SHORT TERM GOAL #5   Title  Pt will decrease fascial restrictions in LUE to minimal amounts or less to improve mobility required for functional reaching tasks.    Time  4    Period  Weeks    Status  On-going      OT SHORT TERM GOAL #6   Title  Pt will improve left shoulder A/ROM to improve ability to reach overhead while putting away groceries.     Time  4    Period  Weeks    Status  On-going           Plan - 04/10/18 1424    Clinical Impression Statement  A: Pt completed A/ROM standing versus sitting and focused on shoulder and scapular strengthening and stabilty this session. VC were needed for form and technque as needed. Patient reports no increased pain although continues to have muscle fatigue during exercises.    Plan  P: Add X to V arms and shoulder retraction with red theraband.     Consulted and Agree with Plan of Care  Patient       Patient will benefit from skilled therapeutic intervention in order to improve the following deficits and impairments:  Impaired flexibility, Pain, Decreased mobility, Decreased activity tolerance, Decreased range of motion, Decreased strength, Impaired UE functional use  Visit Diagnosis: Acute pain of left shoulder  Other symptoms and signs involving the musculoskeletal system  Cervicalgia    Problem List There are no active problems to display for this patient.    Ailene Ravel, OTR/L,CBIS  [phone]  04/10/2018, 2:31 PM  [place] [address] [place], [place], [address] Phone: [phone]   Fax:  [phone]  Name: Stacy Chase MRN: [id number] Date of Birth: 1946-03-27

## 2018-04-11 DIAGNOSIS — Z23 Encounter for immunization: Secondary | ICD-10-CM | POA: Diagnosis not present

## 2018-04-11 DIAGNOSIS — G8929 Other chronic pain: Secondary | ICD-10-CM | POA: Diagnosis not present

## 2018-04-12 ENCOUNTER — Encounter (HOSPITAL_COMMUNITY): Payer: Self-pay | Admitting: Occupational Therapy

## 2018-04-12 ENCOUNTER — Ambulatory Visit (HOSPITAL_COMMUNITY): Payer: Medicare Other | Admitting: Occupational Therapy

## 2018-04-12 DIAGNOSIS — M25512 Pain in left shoulder: Secondary | ICD-10-CM

## 2018-04-12 DIAGNOSIS — M542 Cervicalgia: Secondary | ICD-10-CM

## 2018-04-12 DIAGNOSIS — R29898 Other symptoms and signs involving the musculoskeletal system: Secondary | ICD-10-CM | POA: Diagnosis not present

## 2018-04-12 NOTE — Therapy (Addendum)
[place], [place], [address] Phone: [phone]   Fax:  [phone]  Occupational Therapy Treatment  Patient Details  Name: Stacy Chase MRN: [id number] Date of Birth: [birth year] Referring Provider (OT): Asencion Noble, MD   Encounter Date: 04/12/2018  OT End of Session - 04/12/18 1355    Visit Number  5    Number of Visits  16    Date for OT Re-Evaluation  04/20/18    Authorization Type  1) Medicare Part A 2)[employer]    OT Start Time  1300    OT Stop Time  1343    OT Time Calculation (min)  43 min    Activity Tolerance  Patient tolerated treatment well    Behavior During Therapy  [facility] for tasks assessed/performed       Past Medical History:  Diagnosis Date  . Medical history non-contributory     Past Surgical History:  Procedure Laterality Date  . CATARACT EXTRACTION W/PHACO Left 01/30/2017   Procedure: CATARACT EXTRACTION PHACO AND INTRAOCULAR LENS PLACEMENT (IOC);  Surgeon: Rutherford Guys, MD;  Location: [facility];  Service: Ophthalmology;  Laterality: Left;  CDE: 14.94  . CATARACT EXTRACTION W/PHACO Right 03/06/2017   Procedure: CATARACT EXTRACTION PHACO AND INTRAOCULAR LENS PLACEMENT (IOC);  Surgeon: Rutherford Guys, MD;  Location: [facility];  Service: Ophthalmology;  Laterality: Right;  CDE: 5.93  . CERVICAL POLYPECTOMY    . COLONOSCOPY    . COLONOSCOPY N/A 06/10/2014   Procedure: COLONOSCOPY;  Surgeon: Rogene Houston, MD;  [facility];  Service: Endoscopy;  Laterality: N/A;  930    There were no vitals filed for this visit.  Subjective Assessment - 04/12/18 1318    Subjective   S: I've been feeling stronger lately.     Currently in Pain?  No/denies    Multiple Pain Sites  No         [facility] OT Assessment - 04/12/18 1256      Assessment   Medical Diagnosis  Left Shoulder pain    Referring Provider (OT)  Asencion Noble, MD      Precautions   Precautions  None               OT  Treatments/Exercises (OP) - 04/12/18 1258      Exercises   Exercises  Shoulder      Shoulder Exercises: Supine   Protraction  PROM;5 reps    Horizontal ABduction  PROM;5 reps    External Rotation  PROM;5 reps    Internal Rotation  PROM;5 reps    Flexion  PROM;5 reps    ABduction  PROM;5 reps      Shoulder Exercises: Seated   Elevation  AROM;15 reps      Shoulder Exercises: Standing   Protraction  AROM;15 reps    Horizontal ABduction  AROM;15 reps    External Rotation  AROM;15 reps    Internal Rotation  AROM;15 reps    Flexion  AROM;15 reps    ABduction  AROM;15 reps    Extension  10 reps;Theraband    Theraband Level (Shoulder Extension)  Level 2 (Red)    Row  10 reps;Theraband    Theraband Level (Shoulder Row)  Level 2 (Red)    Retraction  10 reps;Theraband    Theraband Level (Shoulder Retraction)  Level 2 (Red)      Shoulder Exercises: ROM/Strengthening   X to V Arms  15X no rest breaks, visual and  verbal cues provided for form    Proximal Shoulder Strengthening, Seated  15X no rest breaks    Other ROM/Strengthening Exercises  ABC's on door using fingers with shoulder flexed at 90 degrees       Shoulder Exercises: Stretch   Cross Chest Stretch  4 reps;10 seconds;Other (comment)   10" each    Other Shoulder Stretches  Wall circles with washcloth on door in flexion and abduction 1' each      Manual Therapy   Manual Therapy  Soft tissue mobilization    Manual therapy comments  Manual therapy completed prior to exercises.     Soft tissue mobilization  Myofasical release and manual stretching completed to left upper arm, trapezius, scapularis, and cervical region to decrease fascial restrictions and increase joint mobility in a pain free zone.                OT Short Term Goals - 04/03/18 1542      OT SHORT TERM GOAL #1   Title  Patient will be educated on and become independent using HEP to be able to use LUE during ADLs.       Time  4    Period  Weeks     Status  On-going      OT SHORT TERM GOAL #2   Title  Patient will decrease pain in LUE to 2/10 or less to be able to use LUE during food preparation.    Time  4    Period  Weeks    Status  On-going      OT SHORT TERM GOAL #3   Title  Pt will improve cervical A/ROM to improve ability to turn head while driving.        Time  4    Period  Weeks    Status  On-going      OT SHORT TERM GOAL #4   Title  Pt will increase LUE strength to at least 4+/5 to improve ability to carry items during yard work tasks.       Time  4    Period  Weeks    Status  On-going      OT SHORT TERM GOAL #5   Title  Pt will decrease fascial restrictions in LUE to minimal amounts or less to improve mobility required for functional reaching tasks.    Time  4    Period  Weeks    Status  On-going      OT SHORT TERM GOAL #6   Title  Pt will improve left shoulder A/ROM to improve ability to reach overhead while putting away groceries.     Time  4    Period  Weeks    Status  On-going              Plan - 04/12/18 1320    Clinical Impression Statement  A: Pt completed A/ROM in standing while focusing on shoulder and scapular strengthening and stabilty. She reports no increased pain with exercises, although continues to report muscle fatigue. Added X to V arms and shoulder retraction with theraband with min muscle fatigue reported. Verbal and visual cues provided.     Plan  P: Continue with A/ROM in standing to improve shoulder strengthening. Add red loop band next session with wall walk activity. Also add Y liftoff.     Consulted and Agree with Plan of Care  Patient       Patient will benefit from skilled therapeutic intervention  in order to improve the following deficits and impairments:  Impaired flexibility, Pain, Decreased mobility, Decreased activity tolerance, Decreased range of motion, Decreased strength, Impaired UE functional use  Visit Diagnosis: Acute pain of left shoulder  Other symptoms and  signs involving the musculoskeletal system  Cervicalgia    Problem List There are no active problems to display for this patient.   Toney Rakes, Ot student  04/12/2018, 2:22 PM  [facility] [address] [place], [place], [address] Phone: [phone]   Fax:  [phone]  Name: Stacy Chase MRN: [id number] Date of Birth: [birth year]

## 2018-04-17 ENCOUNTER — Ambulatory Visit (HOSPITAL_COMMUNITY): Payer: Medicare Other | Admitting: Occupational Therapy

## 2018-04-17 ENCOUNTER — Ambulatory Visit (HOSPITAL_COMMUNITY): Payer: Medicare Other

## 2018-04-17 ENCOUNTER — Encounter (HOSPITAL_COMMUNITY): Payer: Self-pay | Admitting: Occupational Therapy

## 2018-04-17 DIAGNOSIS — M25512 Pain in left shoulder: Secondary | ICD-10-CM

## 2018-04-17 DIAGNOSIS — M542 Cervicalgia: Secondary | ICD-10-CM | POA: Diagnosis not present

## 2018-04-17 DIAGNOSIS — R29898 Other symptoms and signs involving the musculoskeletal system: Secondary | ICD-10-CM | POA: Diagnosis not present

## 2018-04-17 NOTE — Therapy (Addendum)
[place], [place], [address] Phone: [phone]   Fax:  [phone]  Occupational Therapy Treatment  Patient Details  Name: Stacy Chase MRN: [id number] Date of Birth: October 27, 1945 Referring Provider (OT): Asencion Noble, MD   Encounter Date: 04/17/2018  OT End of Session - 04/17/18 1519    Visit Number  6    Number of Visits  16    Date for OT Re-Evaluation  04/20/18    Authorization Type  1) Medicare Part A 2)[employer]    OT Start Time  1427    OT Stop Time  1516    OT Time Calculation (min)  49 min    Activity Tolerance  Patient tolerated treatment well    Behavior During Therapy  [facility] for tasks assessed/performed       Past Medical History:  Diagnosis Date  . Medical history non-contributory     Past Surgical History:  Procedure Laterality Date  . CATARACT EXTRACTION W/PHACO Left 01/30/2017   Procedure: CATARACT EXTRACTION PHACO AND INTRAOCULAR LENS PLACEMENT (IOC);  Surgeon: Rutherford Guys, MD;  Location: [facility];  Service: Ophthalmology;  Laterality: Left;  CDE: 14.94  . CATARACT EXTRACTION W/PHACO Right 03/06/2017   Procedure: CATARACT EXTRACTION PHACO AND INTRAOCULAR LENS PLACEMENT (IOC);  Surgeon: Rutherford Guys, MD;  Location: [facility];  Service: Ophthalmology;  Laterality: Right;  CDE: 5.93  . CERVICAL POLYPECTOMY    . COLONOSCOPY    . COLONOSCOPY N/A 06/10/2014   Procedure: COLONOSCOPY;  Surgeon: Rogene Houston, MD;  [facility]: [facility];  Service: Endoscopy;  Laterality: N/A;  930    There were no vitals filed for this visit.  Subjective Assessment - 04/17/18 1547    Subjective   S: I'm feeling alright today, my arm just gets tired.    Currently in Pain?  No/denies    Multiple Pain Sites  No         [facility] OT Assessment - 04/17/18 1426      Assessment   Medical Diagnosis  Left Shoulder pain      Precautions   Precautions  None               OT Treatments/Exercises (OP) -  04/17/18 1426      Exercises   Exercises  Shoulder      Shoulder Exercises: Supine   Protraction  PROM;5 reps    Horizontal ABduction  PROM;5 reps    External Rotation  PROM;5 reps    Internal Rotation  PROM;5 reps    Flexion  PROM;5 reps    ABduction  PROM;5 reps      Shoulder Exercises: Standing   Protraction  AROM;15 reps    Horizontal ABduction  AROM;15 reps    External Rotation  AROM;15 reps    Internal Rotation  AROM;15 reps    Flexion  AROM;15 reps    ABduction  AROM;15 reps    Extension  10 reps;Theraband    Theraband Level (Shoulder Extension)  Level 2 (Red)    Row  10 reps;Theraband    Theraband Level (Shoulder Row)  Level 2 (Red)    Retraction  10 reps;Theraband    Theraband Level (Shoulder Retraction)  Level 2 (Red)    Other Standing Exercises  Red loop band: wall walk; min fatigue      Shoulder Exercises: ROM/Strengthening   UBE (Upper Arm Bike)  Level 1 2' forward 2' reverse     X to V  Arms  15X no rest breaks, visual and verbal cues provided for form    Other ROM/Strengthening Exercises  Y liftoff; 5 reps; min difficulty and min fatigue noted after completion       Shoulder Exercises: Stretch   Cross Chest Stretch  5 reps;10 seconds      Manual Therapy   Manual Therapy  Soft tissue mobilization    Manual therapy comments  Manual therapy completed prior to exercises.     Soft tissue mobilization  Myofasical release and manual stretching completed to left upper arm, trapezius, scapularis, and cervical region to decrease fascial restrictions and increase joint mobility in a pain free zone.              OT Education - 04/17/18 1519    Education Details  Pt was provided with an HEP for shoulder A/ROM.    Person(s) Educated  Patient    Methods  Explanation;Handout    Comprehension  Verbalized understanding       OT Short Term Goals - 04/03/18 1542      OT SHORT TERM GOAL #1   Title  Patient will be educated on and become independent using HEP to be  able to use LUE during ADLs.       Time  4    Period  Weeks    Status  On-going      OT SHORT TERM GOAL #2   Title  Patient will decrease pain in LUE to 2/10 or less to be able to use LUE during food preparation.    Time  4    Period  Weeks    Status  On-going      OT SHORT TERM GOAL #3   Title  Pt will improve cervical A/ROM to improve ability to turn head while driving.        Time  4    Period  Weeks    Status  On-going      OT SHORT TERM GOAL #4   Title  Pt will increase LUE strength to at least 4+/5 to improve ability to carry items during yard work tasks.       Time  4    Period  Weeks    Status  On-going      OT SHORT TERM GOAL #5   Title  Pt will decrease fascial restrictions in LUE to minimal amounts or less to improve mobility required for functional reaching tasks.    Time  4    Period  Weeks    Status  On-going      OT SHORT TERM GOAL #6   Title  Pt will improve left shoulder A/ROM to improve ability to reach overhead while putting away groceries.     Time  4    Period  Weeks    Status  On-going             Plan - 04/17/18 1519    Clinical Impression Statement  A: Pt continued with standing A/ROM while focusing on shoulder and scapular stability and strength. Provided pt with A/ROM HEP and she verbalized understanding of education. Introduced wall walk using red loop band as well as Y liftoff with min muscle fatigue reported after both new tasks. Verbal and visual cues provided.     Plan  P: Continue strengthing with red theraband, adding diagonals as well as horizontal abduction.     Consulted and Agree with Plan of Care  Patient  Patient will benefit from skilled therapeutic intervention in order to improve the following deficits and impairments:  Impaired flexibility, Pain, Decreased mobility, Decreased activity tolerance, Decreased range of motion, Decreased strength, Impaired UE functional use  Visit Diagnosis: Acute pain of left  shoulder  Other symptoms and signs involving the musculoskeletal system  Cervicalgia    Problem List There are no active problems to display for this patient.   Toney Rakes, OT student 04/17/2018, 5:40 PM  [facility] [address] [place], [place], [address] Phone: [phone]   Fax:  [phone]  Name: Stacy Chase MRN: [id number] Date of Birth: May 16, 1946

## 2018-04-17 NOTE — Patient Instructions (Signed)
Repeat all exercises 10-15 times, 1-2 times per day.  1) Shoulder Protraction    Begin with elbows by your side, slowly "punch" straight out in front of you.      2) Shoulder Flexion  Supine:     Standing:         Begin with arms at your side with thumbs pointed up, slowly raise both arms up and forward towards overhead.               3) Horizontal abduction/adduction  Supine:   Standing:           Begin with arms straight out in front of you, bring out to the side in at "T" shape. Keep arms straight entire time.                 4) Internal & External Rotation    *No band* -Stand with elbows at the side and elbows bent 90 degrees. Move your forearms away from your body, then bring back inward toward the body.     5) Shoulder Abduction  Supine:     Standing:       Lying on your back begin with your arms flat on the table next to your side. Slowly move your arms out to the side so that they go overhead, in a jumping jack or snow angel movement.    6) X to V arms (cheerleader move):  Begin with arms straight down, crossed in front of body in an "X". Keeping arms crossed, lift arms straight up overhead. Then spread arms apart into a "V" shape.  Bring back together into x and lower down to starting position.    7) W arms:  Begin with elbows bent and even with shoulders, hands pointing to ceiling. Keeping elbows at shoulder level, 1-shrug shoulders up, 2-squeeze shoulder blades together, and 3-relax.     

## 2018-04-19 ENCOUNTER — Encounter (HOSPITAL_COMMUNITY): Payer: Self-pay | Admitting: Occupational Therapy

## 2018-04-19 ENCOUNTER — Ambulatory Visit (HOSPITAL_COMMUNITY): Payer: Medicare Other | Admitting: Occupational Therapy

## 2018-04-19 DIAGNOSIS — M25512 Pain in left shoulder: Secondary | ICD-10-CM | POA: Diagnosis not present

## 2018-04-19 DIAGNOSIS — M542 Cervicalgia: Secondary | ICD-10-CM | POA: Diagnosis not present

## 2018-04-19 DIAGNOSIS — R29898 Other symptoms and signs involving the musculoskeletal system: Secondary | ICD-10-CM

## 2018-04-19 NOTE — Therapy (Addendum)
[place] [place], [place], [address] Phone: [phone]   Fax:  [phone]  Occupational Therapy Treatment  Patient Details  Name: Stacy Chase MRN: [id number] Date of Birth: [birth year] Referring Provider (OT): Asencion Noble, MD   Encounter Date: 04/19/2018  OT End of Session - 04/19/18 1653    Visit Number  7    Number of Visits  16    Date for OT Re-Evaluation  04/20/18    Authorization Type  1) Medicare Part A 2)[employer]    OT Start Time  1300    OT Stop Time  1345    OT Time Calculation (min)  45 min    Activity Tolerance  Patient tolerated treatment well    Behavior During Therapy  [facility] for tasks assessed/performed       Past Medical History:  Diagnosis Date  . Medical history non-contributory     Past Surgical History:  Procedure Laterality Date  . CATARACT EXTRACTION W/PHACO Left 01/30/2017   Procedure: CATARACT EXTRACTION PHACO AND INTRAOCULAR LENS PLACEMENT (IOC);  Surgeon: Rutherford Guys, MD;  Location: [facility];  Service: Ophthalmology;  Laterality: Left;  CDE: 14.94  . CATARACT EXTRACTION W/PHACO Right 03/06/2017   Procedure: CATARACT EXTRACTION PHACO AND INTRAOCULAR LENS PLACEMENT (IOC);  Surgeon: Rutherford Guys, MD;  Location: [facility];  Service: Ophthalmology;  Laterality: Right;  CDE: 5.93  . CERVICAL POLYPECTOMY    . COLONOSCOPY    . COLONOSCOPY N/A 06/10/2014   Procedure: COLONOSCOPY;  Surgeon: Rogene Houston, MD;  Location: [facility];  Service: Endoscopy;  Laterality: N/A;  930    There were no vitals filed for this visit.  Subjective Assessment - 04/19/18 1653    Subjective   S: My arm just gets tired when I exercise it.     Currently in Pain?  No/denies    Multiple Pain Sites  No         [facility] OT Assessment - 04/19/18 1302      Assessment   Medical Diagnosis  Left Shoulder pain      Precautions   Precautions  None               OT Treatments/Exercises (OP) -  04/19/18 1303      Exercises   Exercises  Shoulder      Shoulder Exercises: Supine   Protraction  PROM;5 reps    Horizontal ABduction  PROM;5 reps    External Rotation  PROM;5 reps    Internal Rotation  PROM;5 reps    Flexion  PROM;5 reps    ABduction  PROM;5 reps      Shoulder Exercises: Seated   Horizontal ABduction  Strengthening;10 reps;Theraband    Theraband Level (Shoulder Horizontal ABduction)  Level 2 (Red)    Diagonals  Strengthening;10 reps;Theraband    Theraband Level (Shoulder Diagonals)  Level 2 (Red)      Shoulder Exercises: Standing   Protraction  AROM;15 reps    Horizontal ABduction  AROM;15 reps    External Rotation  AROM;15 reps    Internal Rotation  AROM;15 reps    Flexion  AROM;15 reps    ABduction  AROM;15 reps    Extension  10 reps;Theraband    Theraband Level (Shoulder Extension)  Level 2 (Red)    Row  10 reps;Theraband    Theraband Level (Shoulder Row)  Level 2 (Red)    Retraction  10 reps;Theraband    Theraband Level (Shoulder Retraction)  Level 2 (Red)    Diagonals  Strengthening;5 reps;Theraband    Theraband Level (Shoulder Diagonals)  Level 2 (Red)      Shoulder Exercises: ROM/Strengthening   UBE (Upper Arm Bike)  Level 2 2' forward 2' reverse     X to V Arms  15X no rest breaks    Proximal Shoulder Strengthening, Seated  15X no rest breaks      Manual Therapy   Manual Therapy  Soft tissue mobilization    Manual therapy comments  Manual therapy completed prior to exercises.     Soft tissue mobilization  Myofasical release and manual stretching completed to left upper arm, trapezius, scapularis, and cervical region to decrease fascial restrictions and increase joint mobility in a pain free zone.                OT Short Term Goals - 04/03/18 1542      OT SHORT TERM GOAL #1   Title  Patient will be educated on and become independent using HEP to be able to use LUE during ADLs.       Time  4    Period  Weeks    Status  On-going       OT SHORT TERM GOAL #2   Title  Patient will decrease pain in LUE to 2/10 or less to be able to use LUE during food preparation.    Time  4    Period  Weeks    Status  On-going      OT SHORT TERM GOAL #3   Title  Pt will improve cervical A/ROM to improve ability to turn head while driving.        Time  4    Period  Weeks    Status  On-going      OT SHORT TERM GOAL #4   Title  Pt will increase LUE strength to at least 4+/5 to improve ability to carry items during yard work tasks.       Time  4    Period  Weeks    Status  On-going      OT SHORT TERM GOAL #5   Title  Pt will decrease fascial restrictions in LUE to minimal amounts or less to improve mobility required for functional reaching tasks.    Time  4    Period  Weeks    Status  On-going      OT SHORT TERM GOAL #6   Title  Pt will improve left shoulder A/ROM to improve ability to reach overhead while putting away groceries.     Time  4    Period  Weeks    Status  On-going           Plan - 04/19/18 1654    Clinical Impression Statement  A: Pt continued with standing A/ROM while focusing on shoulder and scapular stability and strength. Introduced diagonals and horizontal abduction with red theraband with min muscle fatigue reported after both new tasks. Verbal and visual cues provided.     Plan  P:  Attempt 1# weights in supine. Attempt pushups at counter height, as tolerated. Add ball on wall with green ball.        Consulted and Agree with Plan of Care  Patient       Patient will benefit from skilled therapeutic intervention in order to improve the following deficits and impairments:  Impaired flexibility, Pain, Decreased mobility, Decreased activity tolerance, Decreased range of motion, Decreased strength,  Impaired UE functional use  Visit Diagnosis: Acute pain of left shoulder  Other symptoms and signs involving the musculoskeletal system  Cervicalgia    Problem List There are no active problems to  display for this patient.   Toney Rakes, OT student 04/19/2018, 5:47 PM  [facility] [address] [place], [place], [address] Phone: [phone]   Fax:  [phone]  Name: KYNSLEY WHITEHOUSE MRN: [id number] Date of Birth: [birth year]

## 2018-04-24 ENCOUNTER — Encounter (HOSPITAL_COMMUNITY): Payer: Self-pay

## 2018-04-24 ENCOUNTER — Ambulatory Visit (HOSPITAL_COMMUNITY): Payer: Medicare Other

## 2018-04-24 DIAGNOSIS — M542 Cervicalgia: Secondary | ICD-10-CM | POA: Diagnosis not present

## 2018-04-24 DIAGNOSIS — M25512 Pain in left shoulder: Secondary | ICD-10-CM | POA: Diagnosis not present

## 2018-04-24 DIAGNOSIS — R29898 Other symptoms and signs involving the musculoskeletal system: Secondary | ICD-10-CM | POA: Diagnosis not present

## 2018-04-24 NOTE — Therapy (Signed)
[facility] [address] [place], [place], [address] Phone: [phone]   Fax:  [phone]  Occupational Therapy Treatment Reassessment and D/C summary Patient Details  Name: Stacy Chase MRN: [id number] Date of Birth: Nov 25, 1945 Referring Provider (OT): Asencion Noble, MD   Encounter Date: 04/24/2018  OT End of Session - 04/24/18 1353    Visit Number  8    Number of Visits  16    Authorization Type  1) Medicare Part A 2)[employer]    OT Start Time  6599    OT Stop Time  3570    OT Time Calculation (min)  42 min    Activity Tolerance  Patient tolerated treatment well    Behavior During Therapy  [facility] for tasks assessed/performed       Past Medical History:  Diagnosis Date  . Medical history non-contributory     Past Surgical History:  Procedure Laterality Date  . CATARACT EXTRACTION W/PHACO Left 01/30/2017   Procedure: CATARACT EXTRACTION PHACO AND INTRAOCULAR LENS PLACEMENT (IOC);  Surgeon: Rutherford Guys, MD;  Location: [facility];  Service: Ophthalmology;  Laterality: Left;  CDE: 14.94  . CATARACT EXTRACTION W/PHACO Right 03/06/2017   Procedure: CATARACT EXTRACTION PHACO AND INTRAOCULAR LENS PLACEMENT (IOC);  Surgeon: Rutherford Guys, MD;  Location: [facility];  Service: Ophthalmology;  Laterality: Right;  CDE: 5.93  . CERVICAL POLYPECTOMY    . COLONOSCOPY    . COLONOSCOPY N/A 06/10/2014   Procedure: COLONOSCOPY;  Surgeon: Rogene Houston, MD;  [facility];  Service: Endoscopy;  Laterality: N/A;  930    There were no vitals filed for this visit.  Subjective Assessment - 04/24/18 1351    Subjective   S: I'm only have a little pain when I turn all the way to the right and left when I'm driving.     Special Tests  FOTO score: 84/100    Currently in Pain?  No/denies         [facility] OT Assessment - 04/24/18 1314      Assessment   Medical Diagnosis  Left Shoulder pain    Referring Provider (OT)  Asencion Noble, MD    Onset  Date/Surgical Date  01/30/18      Precautions   Precautions  None      Prior Function   Level of Independence  Independent      Observation/Other Assessments   Focus on Therapeutic Outcomes (FOTO)   84/100      ROM / Strength   AROM / PROM / Strength  AROM;PROM;Strength      Palpation   Palpation comment  Min fascial restrictions in left upper arm, trapezius, and scapularis region.       AROM   Overall AROM Comments  assessed seated; IR/er adducted    AROM Assessment Site  Shoulder    Right/Left Shoulder  Left    Left Shoulder Flexion  147 Degrees   previous: 120   Left Shoulder ABduction  145 Degrees   previous: 110   Left Shoulder Internal Rotation  90 Degrees   previous: same   Left Shoulder External Rotation  36 Degrees   previous: 56   Cervical Flexion  80   previous: 33   Cervical Extension  60   previous: same   Cervical - Right Side Bend  60   previous: 38   Cervical - Left Side Bend  45   previuos: 14   Cervical - Right Rotation  70   previous: same   Cervical - Left Rotation  60   previous: 31     PROM   Overall PROM Comments  assessed in supine, IR/er adducted    PROM Assessment Site  Shoulder    Right/Left Shoulder  Left    Left Shoulder Flexion  171 Degrees   previous: 130   Left Shoulder ABduction  180 Degrees   previous: 98   Left Shoulder Internal Rotation  90 Degrees   previous: same   Left Shoulder External Rotation  60 Degrees   previous: 43     Strength   Overall Strength Comments  assessed seated; IR/er adducted    Strength Assessment Site  Shoulder    Right/Left Shoulder  Left    Left Shoulder Flexion  5/5   previous: 4_/5   Left Shoulder ABduction  5/5   previous: 3+/5   Left Shoulder Internal Rotation  5/5   previous: 4_/5   Left Shoulder External Rotation  5/5   previous: 4_/5              OT Treatments/Exercises (OP) - 04/24/18 0001      Exercises   Exercises  Neck      Neck Exercises: Stretches   Neck  Stretch  1 rep;30 seconds   bilateral side   Other Neck Stretches  Cervical rotation stretch; Bilateral sides; 30 seconds      Manual Therapy   Manual Therapy  Soft tissue mobilization    Manual therapy comments  Manual therapy completed prior to exercises.     Soft tissue mobilization  Myofasical release and manual stretching completed to left upper arm, trapezius, scapularis, and cervical region to decrease fascial restrictions and increase joint mobility in a pain free zone.              OT Education - 04/24/18 1354    Education Details  Cervical rotation and lateral bend stretch HEP. Self myofascial release with cervical region.     Person(s) Educated  Patient    Methods  Explanation;Handout;Demonstration    Comprehension  Verbalized understanding;Returned demonstration       OT Short Term Goals - 04/24/18 1327      OT SHORT TERM GOAL #1   Title  Patient will be educated on and become independent using HEP to be able to use LUE during ADLs.       Time  4    Period  Weeks    Status  Achieved      OT SHORT TERM GOAL #2   Title  Patient will decrease pain in LUE to 2/10 or less to be able to use LUE during food preparation.    Time  4    Period  Weeks    Status  Achieved      OT SHORT TERM GOAL #3   Title  Pt will improve cervical A/ROM to improve ability to turn head while driving.        Time  4    Period  Weeks    Status  Achieved      OT SHORT TERM GOAL #4   Title  Pt will increase LUE strength to at least 4+/5 to improve ability to carry items during yard work tasks.       Time  4    Period  Weeks    Status  Achieved      OT SHORT TERM GOAL #5   Title  Pt will decrease  fascial restrictions in LUE to minimal amounts or less to improve mobility required for functional reaching tasks.    Time  4    Period  Weeks    Status  Achieved      OT SHORT TERM GOAL #6   Title  Pt will improve left shoulder A/ROM to improve ability to reach overhead while putting  away groceries.     Time  4    Period  Weeks    Status  Achieved           Plan - 04/24/18 1355    Clinical Impression Statement  A: Reassessment completed this section. patient has met all therapy goals. She has normal ROM in neck and left UE. She reports only pain with full cervical rotation right and left. HEP was updated to focus on cervical pain and patient was educated on self myofascial release techniques for her neck. Discharge was recommended as patient is able to continue to work on cerivcal pain independently at home. patient is in agreement with recommendations.     Plan  P: D/C from OT services with HEP.     Consulted and Agree with Plan of Care  Patient       Patient will benefit from skilled therapeutic intervention in order to improve the following deficits and impairments:  Impaired flexibility, Pain, Decreased mobility, Decreased activity tolerance, Decreased range of motion, Decreased strength, Impaired UE functional use  Visit Diagnosis: Acute pain of left shoulder  Other symptoms and signs involving the musculoskeletal system  Cervicalgia    Problem List There are no active problems to display for this patient.   OCCUPATIONAL THERAPY DISCHARGE SUMMARY  Visits from Start of Care: 8  Current functional level related to goals / functional outcomes: See above   Remaining deficits: See above   Education / Equipment: See above  Plan: Patient agrees to discharge.  Patient goals were met. Patient is being discharged due to meeting the stated rehab goals.  ?????          Ailene Ravel, OTR/L,CBIS  [phone]  04/24/2018, 1:58 PM  [facility] [address] [place], [place], [address] Phone: [phone]   Fax:  [phone]  Name: Stacy Chase MRN: [id number] Date of Birth: [birth year]

## 2018-04-24 NOTE — Patient Instructions (Signed)
[place] Stretch, Sitting  Repeat __10_ times per session. Do _1-2__ sessions per day.  Copyright  VHI. All rights reserved.  Scalene Stretch, Sitting   Sit, one hand tucked under hip on side to be stretched, other hand over top of head. Gently pull head to side and backwards. Hold ___ seconds. For more stretch, lean body in direction of head pull. Repeat ___ times per session. Do ___ sessions per day.  Copyright  VHI. All rights reserved.  Side Bend, Standing   Stand, one hand grasping other arm above wrist behind body. Pull down while gently tilting head toward pulling side. Hold ___ seconds. Repeat ___ times per session. Do ___ sessions per day.   CERVICAL TOWEL ROTATION STRETCH  Hold the ends of a small folded bath towel and wrap it around your head and neck as shown. Place the towel on your face so as to minimize placing pressure on your jaw. Pressure should be placed on the side of your face/cheek bone.   Use your bottom most arm to anchor the towel in place. Use your top most arm to pull the towel to cause a gentle rotational stretch in your neck. Hold, then return to starting position and repeat.       Myofascial release with ball at neck: rotation  With ball under neck in comfortable position, gently roll your head to one side, letting you neck relax and the ball press into the muscles at the sides of the neck. Breathe /relax/ hold 10-20 sec, Repeat other side      Myofascial Release with Ball at neck  Place Small 4-5 inch ball under neck where comfortable, allowing the head to relax over the ball.  Rest here for 2-5 min, assisting relaxation with deep breathing *It helps to keep the ball near your bed and use at night to relax the neck muscles before sleep.

## 2018-04-26 ENCOUNTER — Encounter (HOSPITAL_COMMUNITY): Payer: 59 | Admitting: Occupational Therapy

## 2018-06-07 ENCOUNTER — Other Ambulatory Visit (HOSPITAL_COMMUNITY): Payer: Self-pay | Admitting: Internal Medicine

## 2018-06-07 DIAGNOSIS — M47812 Spondylosis without myelopathy or radiculopathy, cervical region: Secondary | ICD-10-CM

## 2018-06-13 ENCOUNTER — Ambulatory Visit (HOSPITAL_COMMUNITY)
Admission: RE | Admit: 2018-06-13 | Discharge: 2018-06-13 | Disposition: A | Discharge status: Home / Self Care | Payer: Medicare Other | Source: Ambulatory Visit | Attending: Internal Medicine | Admitting: Internal Medicine

## 2018-06-13 DIAGNOSIS — M47812 Spondylosis without myelopathy or radiculopathy, cervical region: Secondary | ICD-10-CM | POA: Insufficient documentation

## 2018-06-13 DIAGNOSIS — M4802 Spinal stenosis, cervical region: Secondary | ICD-10-CM | POA: Insufficient documentation

## 2018-06-13 DIAGNOSIS — M50321 Other cervical disc degeneration at C4-C5 level: Secondary | ICD-10-CM | POA: Diagnosis not present

## 2018-06-13 DIAGNOSIS — M542 Cervicalgia: Secondary | ICD-10-CM | POA: Diagnosis not present

## 2018-06-18 DIAGNOSIS — M4802 Spinal stenosis, cervical region: Secondary | ICD-10-CM | POA: Diagnosis not present

## 2018-07-01 DIAGNOSIS — M4802 Spinal stenosis, cervical region: Secondary | ICD-10-CM | POA: Diagnosis not present

## 2018-07-01 DIAGNOSIS — M542 Cervicalgia: Secondary | ICD-10-CM | POA: Diagnosis not present

## 2018-08-28 ENCOUNTER — Other Ambulatory Visit: Payer: Medicare Other | Admitting: Obstetrics and Gynecology

## 2018-09-18 ENCOUNTER — Encounter: Payer: Self-pay | Admitting: Obstetrics and Gynecology

## 2018-09-18 ENCOUNTER — Other Ambulatory Visit (HOSPITAL_COMMUNITY)
Admission: RE | Admit: 2018-09-18 | Discharge: 2018-09-18 | Disposition: A | Discharge status: Home / Self Care | Payer: Medicare Other | Source: Ambulatory Visit | Attending: Obstetrics and Gynecology | Admitting: Obstetrics and Gynecology

## 2018-09-18 ENCOUNTER — Ambulatory Visit (INDEPENDENT_AMBULATORY_CARE_PROVIDER_SITE_OTHER): Payer: Medicare Other | Admitting: Obstetrics and Gynecology

## 2018-09-18 ENCOUNTER — Other Ambulatory Visit: Payer: Self-pay

## 2018-09-18 VITALS — BP 142/78 | HR 66 | Ht 69.2 in | Wt 129.0 lb

## 2018-09-18 DIAGNOSIS — Z124 Encounter for screening for malignant neoplasm of cervix: Secondary | ICD-10-CM

## 2018-09-18 DIAGNOSIS — Z1151 Encounter for screening for human papillomavirus (HPV): Secondary | ICD-10-CM | POA: Diagnosis not present

## 2018-09-18 DIAGNOSIS — Z780 Asymptomatic menopausal state: Secondary | ICD-10-CM | POA: Diagnosis not present

## 2018-09-18 NOTE — Progress Notes (Addendum)
Patient ID: Stacy Chase, female   DOB: 1945/08/14, 73 y.o.   MRN: [id number]  Assessment:  Annual Gyn Exam Atrophic vaginitis, postmenopausal Plan:  1. pap smear done, next pap due never 2. return annually or prn 3    Annual mammogram advised after age 85 Subjective:  Stacy Chase is a 73 y.o. female No obstetric history on file. who presents for annual exam. No LMP recorded. Patient is postmenopausal. The patient has complaints today of none. Recently had a death in the family last week (father of her son) which caused her loss of appetite. She believes she lost some weight due to not eating.  The following portions of the patient's history were reviewed and updated as appropriate: allergies, current medications, past family history, past medical history, past social history, past surgical history and problem list. Past Medical History:  Diagnosis Date  . Medical history non-contributory     Past Surgical History:  Procedure Laterality Date  . CATARACT EXTRACTION W/PHACO Left 01/30/2017   Procedure: CATARACT EXTRACTION PHACO AND INTRAOCULAR LENS PLACEMENT (IOC);  Surgeon: Rutherford Guys, MD;  Location: [facility];  Service: Ophthalmology;  Laterality: Left;  CDE: 14.94  . CATARACT EXTRACTION W/PHACO Right 03/06/2017   Procedure: CATARACT EXTRACTION PHACO AND INTRAOCULAR LENS PLACEMENT (IOC);  Surgeon: Rutherford Guys, MD;  Location: [facility];  Service: Ophthalmology;  Laterality: Right;  CDE: 5.93  . CERVICAL POLYPECTOMY    . COLONOSCOPY    . COLONOSCOPY N/A 06/10/2014   Procedure: COLONOSCOPY;  Surgeon: Rogene Houston, MD;  [facility];  Service: Endoscopy;  Laterality: N/A;  930     Current Outpatient Medications:  .  triamcinolone cream (KENALOG) 0.1 %, Apply 1 application topically 3 (three) times daily. (Patient not taking: Reported on 09/18/2018), Disp: 15 g, Rfl: 0  Review of Systems Constitutional: negative Gastrointestinal: negative Genitourinary: normal  Objective:   BP (!) 142/78 (BP Location: Right Arm, Patient Position: Sitting, Cuff Size: Normal)   Pulse 66   Ht 5' 9.2" (1.758 m)   Wt 129 lb (58.5 kg)   BMI 18.94 kg/m    BMI: Body mass index is 18.94 kg/m.  General Appearance: Alert, appropriate appearance for age. No acute distress HEENT: Grossly normal Neck / Thyroid:  Cardiovascular: RRR; normal S1, S2, no murmur Lungs: CTA bilaterally Back: No CVAT Breast Exam: No masses or nodes.No dimpling, nipple retraction or discharge. Breast tissue even upon examination. Gastrointestinal: Soft, non-tender, no masses or organomegaly Pelvic Exam:   VAGINA: atrophic postmenopausal tissues, good muscle support  CERVIX: normal appearing cervix without discharge or lesions,  UTERUS: uterus is normal size, shape, consistency and nontender,  RECTAL: guaiac negative stool obtained,  PAP: Pap smear done today. Lymphatic Exam: Non-palpable nodes in neck, clavicular, axillary, or inguinal regions  Skin: no rash or abnormalities Neurologic: Normal gait and speech, no tremor  Psychiatric: Alert and oriented, appropriate affect.  Urinalysis:Not done  By signing my name below, I, Stacy Chase, attest that this documentation has been prepared under the direction and in the presence of Jonnie Kind, MD. Electronically Signed: [place]. 09/18/18. 1:49 PM.  I personally performed the services described in this documentation, which was SCRIBED in my presence. The recorded information has been reviewed and considered accurate. It has been edited as necessary during review. Jonnie Kind, MD

## 2018-09-20 LAB — CYTOLOGY - PAP
DIAGNOSIS: NEGATIVE
HPV (WINDOPATH): DETECTED — AB

## 2018-09-23 ENCOUNTER — Telehealth: Payer: Self-pay | Admitting: *Deleted

## 2018-09-23 DIAGNOSIS — Z0001 Encounter for general adult medical examination with abnormal findings: Secondary | ICD-10-CM | POA: Diagnosis not present

## 2018-09-23 DIAGNOSIS — E785 Hyperlipidemia, unspecified: Secondary | ICD-10-CM | POA: Diagnosis not present

## 2018-09-23 DIAGNOSIS — K219 Gastro-esophageal reflux disease without esophagitis: Secondary | ICD-10-CM | POA: Diagnosis not present

## 2018-09-23 NOTE — Telephone Encounter (Signed)
Patient informed per Dr Glo Herring HPV positive, Negative pap cytology and potocols say to repeat pap in a yr.Pt verbalized understanding with no further questions.

## 2018-09-30 DIAGNOSIS — E785 Hyperlipidemia, unspecified: Secondary | ICD-10-CM | POA: Diagnosis not present

## 2018-09-30 DIAGNOSIS — M4802 Spinal stenosis, cervical region: Secondary | ICD-10-CM | POA: Diagnosis not present

## 2018-10-23 DIAGNOSIS — M542 Cervicalgia: Secondary | ICD-10-CM | POA: Diagnosis not present

## 2018-11-17 IMAGING — MG DIGITAL SCREENING BILATERAL MAMMOGRAM WITH TOMO AND CAD
8 series · 9 of 24 positions shown · non-contrast
Comparison: Previous exam(s).

CLINICAL DATA: Screening.

EXAM:
DIGITAL SCREENING BILATERAL MAMMOGRAM WITH TOMO AND CAD

[L CC synth-2D]
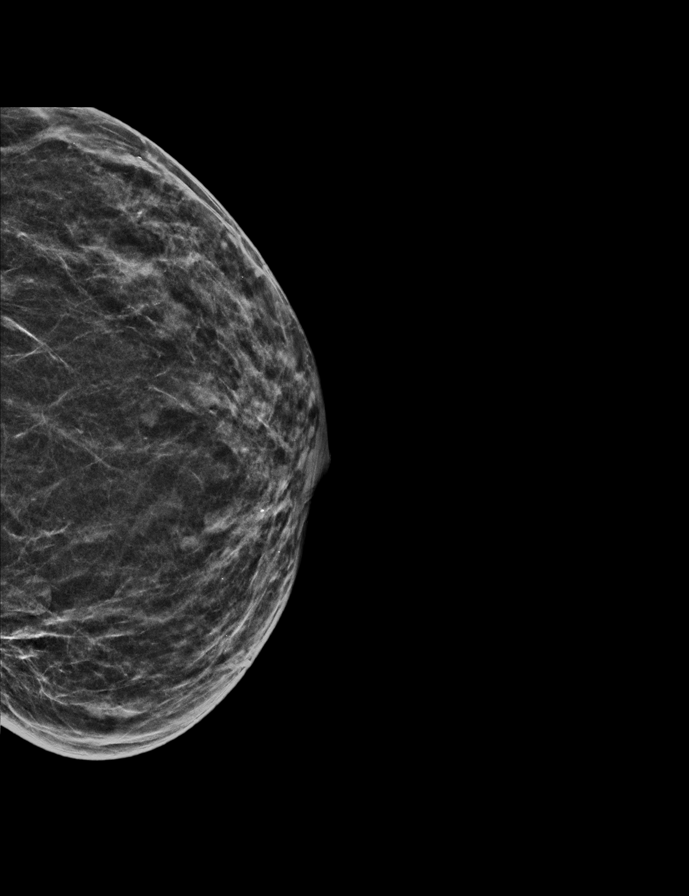

[L MLO synth-2D]
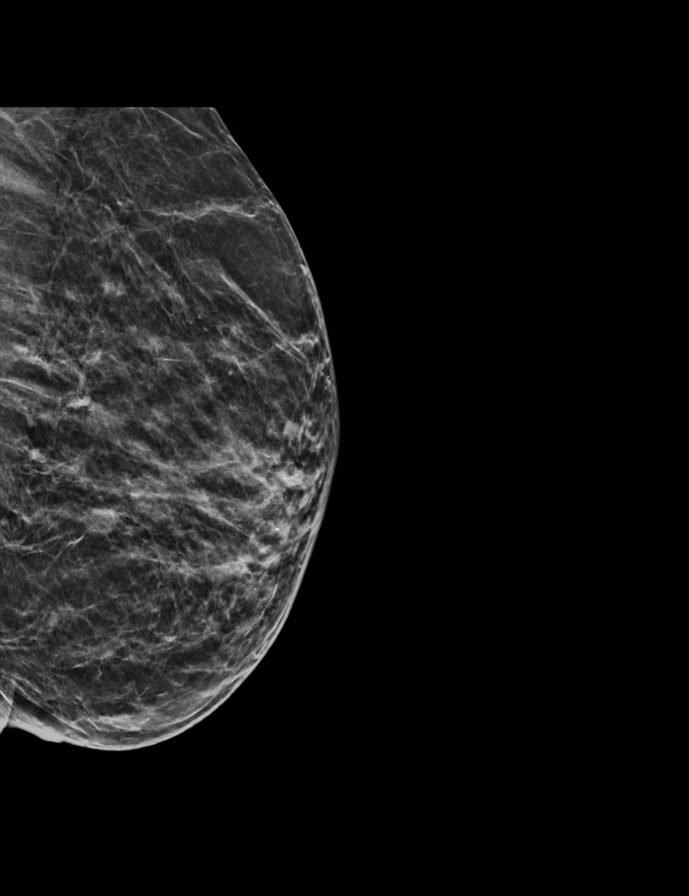

[R CC synth-2D]
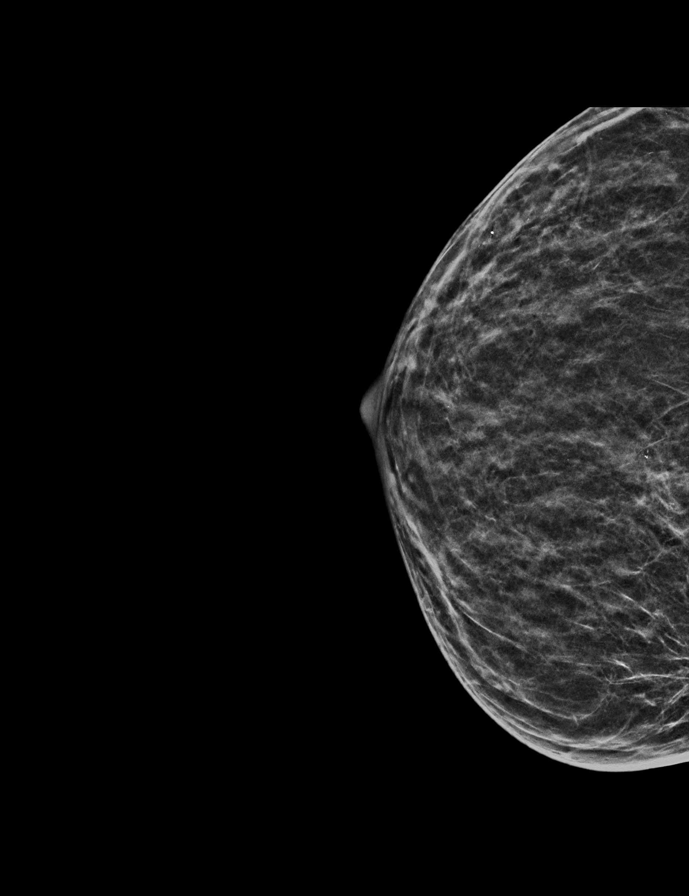

[R MLO synth-2D]
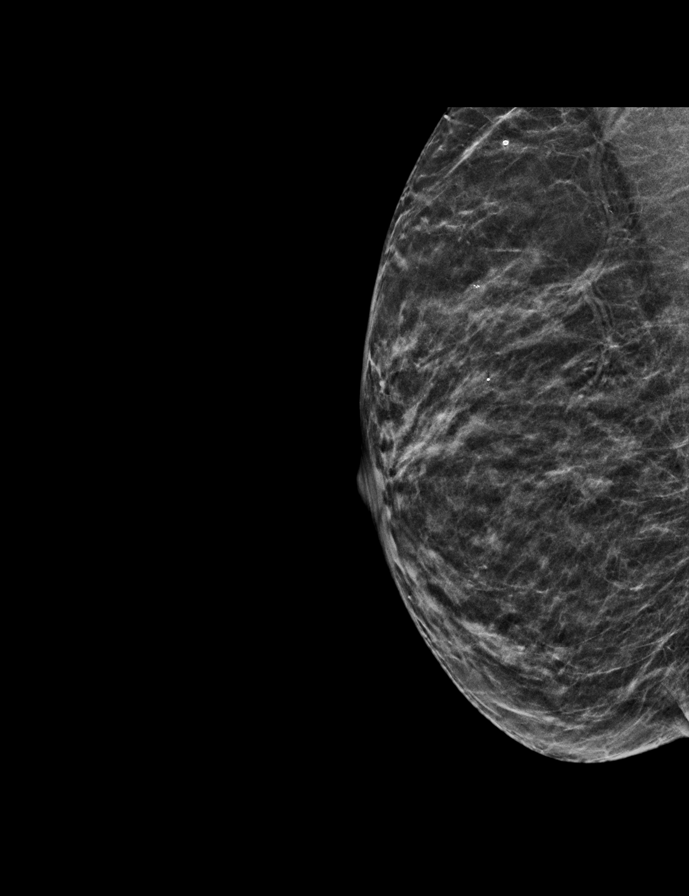

[L MLO tomo · 2 of 37 frames shown]
[frame 13/37]
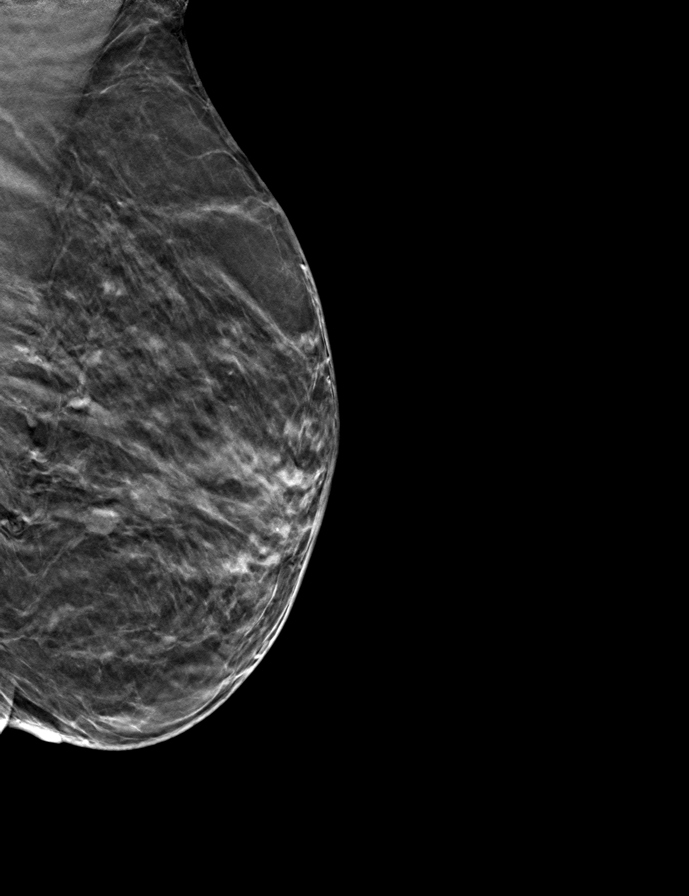
[frame 19/37]
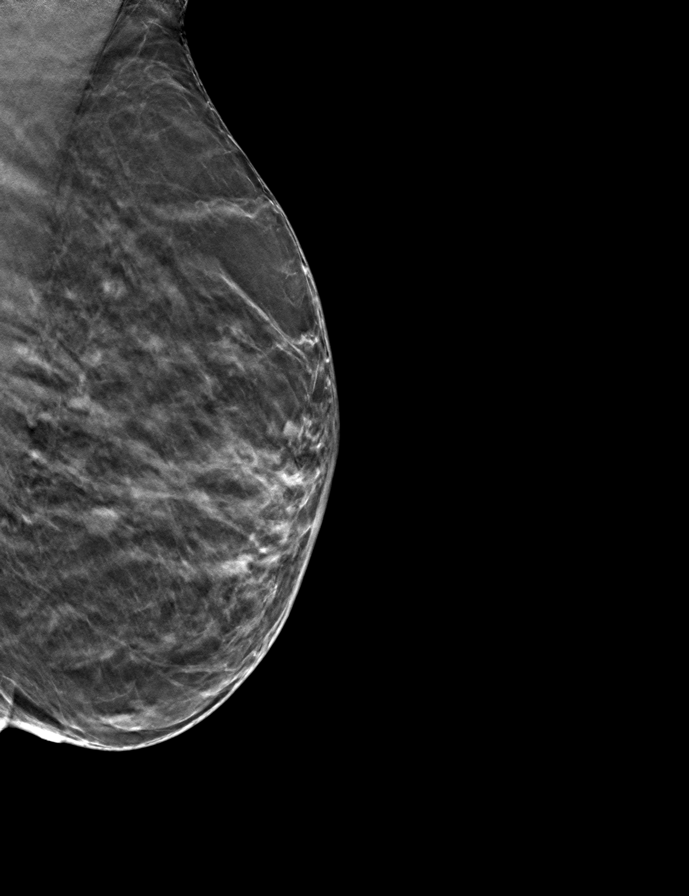

[R MLO tomo · tomo slice 21/42.0]
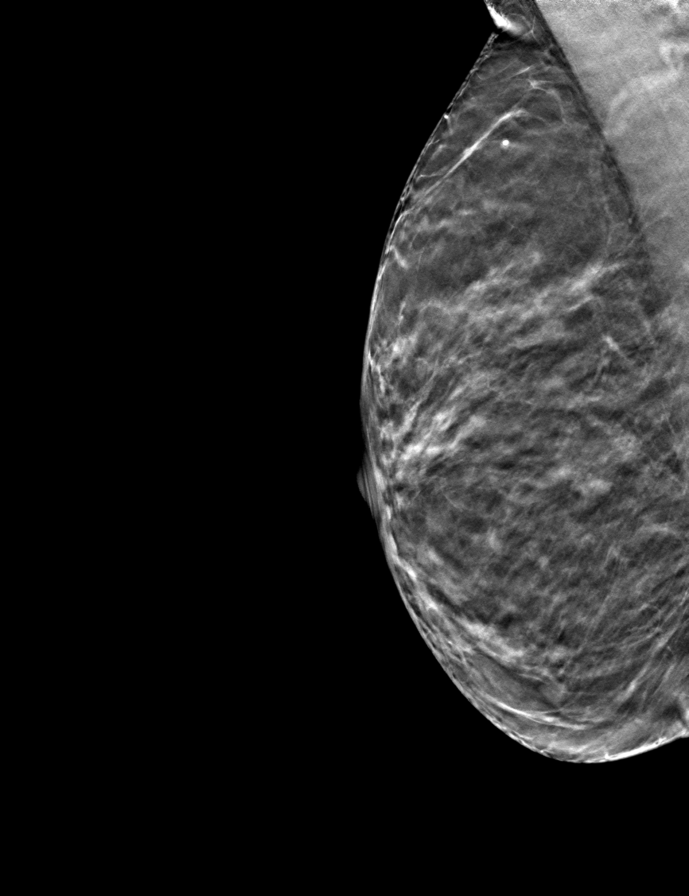

[L CC tomo · tomo slice 21/41.0]
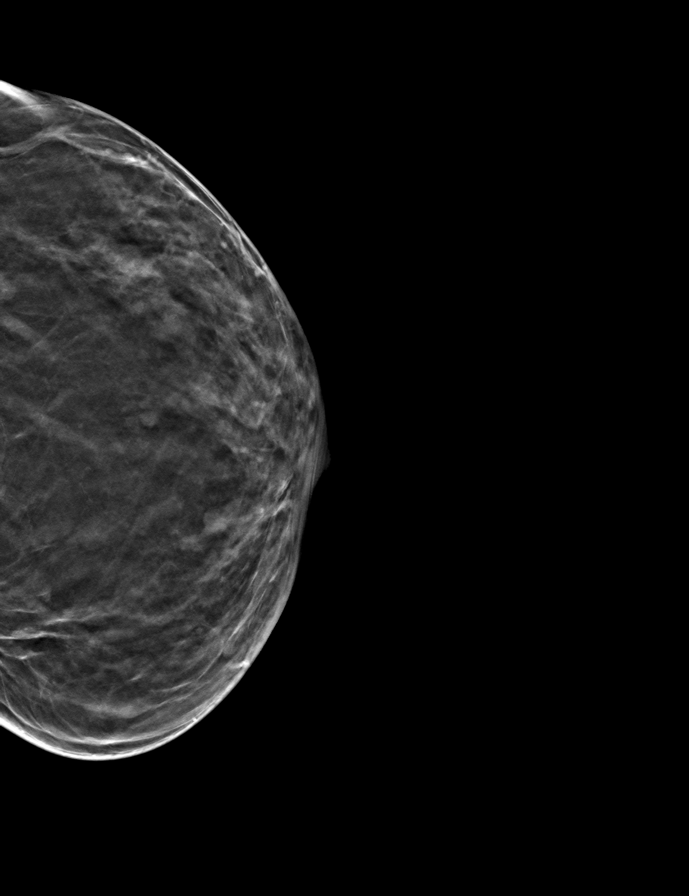

[R CC tomo · tomo slice 21/40.0]
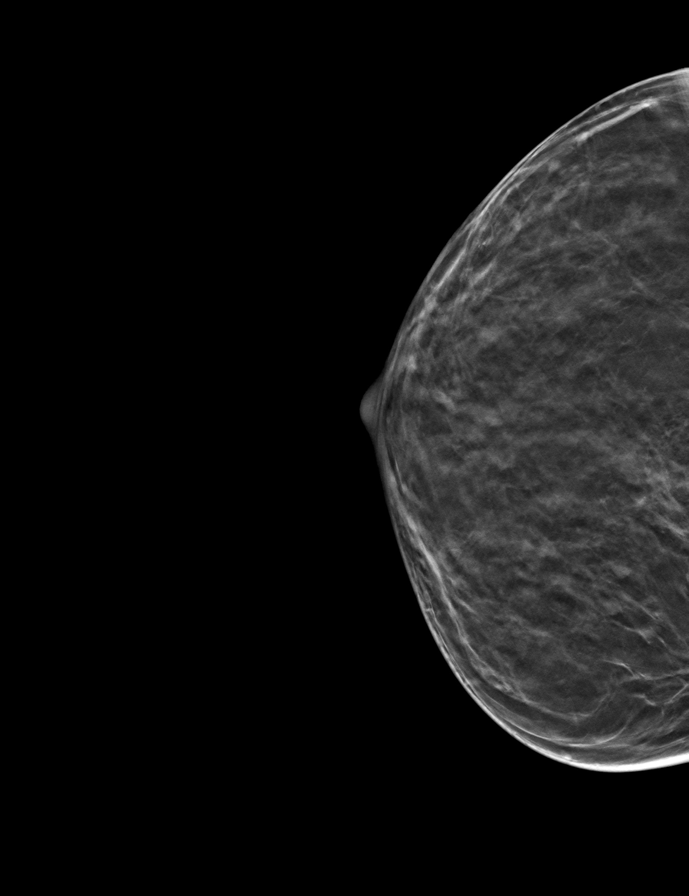

[9 of 24 positions shown; findings below may reference images not displayed]

ACR Breast Density Category b: There are scattered areas of
fibroglandular density.
FINDINGS: There are no findings suspicious for malignancy. Images were
processed with CAD.
IMPRESSION: No mammographic evidence of malignancy. A result letter of this
screening mammogram will be mailed directly to the patient.

RECOMMENDATION:
Screening mammogram in one year. (Code:[id number])

BI-RADS CATEGORY  1: Negative.

## 2018-12-01 ENCOUNTER — Encounter (HOSPITAL_COMMUNITY): Payer: Self-pay | Admitting: Emergency Medicine

## 2018-12-01 ENCOUNTER — Emergency Department (HOSPITAL_COMMUNITY)
Admission: EM | Admit: 2018-12-01 | Discharge: 2018-12-01 | Disposition: A | Discharge status: Home / Self Care | Payer: Medicare Other | Attending: Emergency Medicine | Admitting: Emergency Medicine

## 2018-12-01 ENCOUNTER — Other Ambulatory Visit: Payer: Self-pay

## 2018-12-01 DIAGNOSIS — Y93H2 Activity, gardening and landscaping: Secondary | ICD-10-CM | POA: Insufficient documentation

## 2018-12-01 DIAGNOSIS — Y92007 Garden or yard of unspecified non-institutional (private) residence as the place of occurrence of the external cause: Secondary | ICD-10-CM | POA: Diagnosis not present

## 2018-12-01 DIAGNOSIS — Y999 Unspecified external cause status: Secondary | ICD-10-CM | POA: Insufficient documentation

## 2018-12-01 DIAGNOSIS — W57XXXA Bitten or stung by nonvenomous insect and other nonvenomous arthropods, initial encounter: Secondary | ICD-10-CM | POA: Diagnosis not present

## 2018-12-01 DIAGNOSIS — S30861A Insect bite (nonvenomous) of abdominal wall, initial encounter: Secondary | ICD-10-CM | POA: Insufficient documentation

## 2018-12-01 NOTE — ED Provider Notes (Signed)
Medical screening examination/treatment/procedure(s) were conducted as a shared visit with non-physician practitioner(s) and myself.  I personally evaluated the patient during the encounter.  None   Patient seen by me along with physician assistant.  Patient here due to tick that she removed on her right lower abdomen last night.  Patient has swelling and redness to that area and some itching.  Patient also concerned about Lyme's disease exposure and [facility] spotted fever exposure.  Patient is not allergic to to doxycycline.  Patient seems to be having a little bit of a hyper immune response from where the tick bit her.  She she removed it no evidence of the tick head still present.  It was a small tick.  But does have some satellite areas of a little bit of swelling probably where the tick had bit her in a few places besides where it finally settled in.  Patient nontoxic no acute distress.  No fevers no upper respiratory symptoms.  No evidence of secondary infection.  Would recommend treatment with doxycycline for prophylaxis.  As well is probably Benadryl.  Patient cautioned that this may continue to itch for a while and may be some wounds may open up a little bit and there may be a little bit of oozing.  Similar to like a poison ivy reaction.   Fredia Sorrow, MD 12/01/18 1500

## 2018-12-01 NOTE — ED Provider Notes (Signed)
[facility] EMERGENCY DEPARTMENT Provider Note   CSN: [id number] Arrival date & time: 12/01/18  1437    History   Chief Complaint Chief Complaint  Patient presents with  . Tick Removal    HPI Stacy Chase is a 73 y.o. female who presents with a tick bite. No significant PMH. She states she was out working in her garden on Friday. Yesterday she pulled a tick off her right lower abdomen. It was not engorged. She denies any systemic symptoms. She became worried because one of her church friends told her to come to the ED immediately to be checked. She has been using alcohol to swab the area. It is itchy. She has not tried any meds at home.   HPI  Past Medical History:  Diagnosis Date  . Medical history non-contributory     There are no active problems to display for this patient.   Past Surgical History:  Procedure Laterality Date  . CATARACT EXTRACTION W/PHACO Left 01/30/2017   Procedure: CATARACT EXTRACTION PHACO AND INTRAOCULAR LENS PLACEMENT (IOC);  Surgeon: Rutherford Guys, MD;  Location: [facility];  Service: Ophthalmology;  Laterality: Left;  CDE: 14.94  . CATARACT EXTRACTION W/PHACO Right 03/06/2017   Procedure: CATARACT EXTRACTION PHACO AND INTRAOCULAR LENS PLACEMENT (IOC);  Surgeon: Rutherford Guys, MD;  Location: [facility];  Service: Ophthalmology;  Laterality: Right;  CDE: 5.93  . CERVICAL POLYPECTOMY    . COLONOSCOPY    . COLONOSCOPY N/A 06/10/2014   Procedure: COLONOSCOPY;  Surgeon: Rogene Houston, MD;  Location: [facility];  Service: Endoscopy;  Laterality: N/A;  930     OB History   No obstetric history on file.      Home Medications    Prior to Admission medications   Medication Sig Start Date End Date Taking? Authorizing Provider  triamcinolone cream (KENALOG) 0.1 % Apply 1 application topically 3 (three) times daily. Patient not taking: Reported on 09/18/2018 12/08/17   Kem Parkinson, PA-C    Family History Family History  Problem Relation Age of Onset  .  Hypertension Mother   . Diabetes Sister   . Cancer Sister        breast  . Hypertension Sister   . Cancer Brother        throat  . Hypertension Maternal Grandmother   . Hypertension Maternal Grandfather   . Diabetes Sister     Social History Social History   Tobacco Use  . Smoking status: Never Smoker  . Smokeless tobacco: Never Used  Substance Use Topics  . Alcohol use: No  . Drug use: No     Allergies   Cortisone and Morphine and related   Review of Systems Review of Systems  Constitutional: Negative for fever.  Skin: Positive for rash.     Physical Exam Updated Vital Signs BP (!) 165/79 (BP Location: Left Arm)   Pulse 71   Temp 98.4 F (36.9 C) (Oral)   Resp 16   Ht 5\' 10"  (1.778 m)   Wt 59 kg   SpO2 100%   BMI 18.65 kg/m   Physical Exam Vitals signs and nursing note reviewed.  Constitutional:      General: She is not in acute distress.    Appearance: Normal appearance. She is well-developed. She is not ill-appearing.     Comments: Calm, cooperative. Well appearing  HENT:     Head: Normocephalic and atraumatic.  Eyes:     General: No scleral icterus.  Right eye: No discharge.        Left eye: No discharge.     Conjunctiva/sclera: Conjunctivae normal.     Pupils: Pupils are equal, round, and reactive to light.  Neck:     Musculoskeletal: Normal range of motion.  Cardiovascular:     Rate and Rhythm: Normal rate.  Pulmonary:     Effort: Pulmonary effort is normal. No respiratory distress.  Abdominal:     General: There is no distension.  Skin:    General: Skin is warm and dry.     Findings: Rash (raised, red area over the right lower abdomen consistent with insect bite) present.  Neurological:     Mental Status: She is alert and oriented to person, place, and time.  Psychiatric:        Behavior: Behavior normal.      ED Treatments / Results  Labs (all labs ordered are listed, but only abnormal results are displayed) Labs  Reviewed - No data to display  EKG None  Radiology No results found.  Procedures Procedures (including critical care time)  Medications Ordered in ED Medications - No data to display   Initial Impression / Assessment and Plan / ED Course  I have reviewed the triage vital signs and the nursing notes.  Pertinent labs & imaging results that were available during my care of the patient were reviewed by me and considered in my medical decision making (see chart for details).  73 year old female presents with a tick bite. She appears well. She is hypertensive but otherwise vitals are normal. She has already pulled the tick off and it was attached ~24 hours. I do not think she needs Doxycycline currently and prophylaxis with Doxy is not indicated. Advised Benadryl cream as needed for itching which she can get over the counter. Shared visit with Dr. Rogene Houston. Advised return if worsening. She verbalized understanding.   Final Clinical Impressions(s) / ED Diagnoses   Final diagnoses:  Tick bite, initial encounter    ED Discharge Orders    None       Recardo Evangelist, PA-C 12/01/18 1525    Fredia Sorrow, MD 12/02/18 [id number]

## 2018-12-01 NOTE — ED Notes (Signed)
Raised, reddened area to pt R Lower abd  Area is very small and pt reports cleaning it with alcohol

## 2018-12-01 NOTE — ED Notes (Signed)
Removed tick from abd yesterday   Here for eval of same

## 2018-12-01 NOTE — Discharge Instructions (Signed)
Use benadryl cream which is over the counter and apply as directed Please return if you develop a fever or worsening rash

## 2018-12-01 NOTE — ED Triage Notes (Signed)
Pt here for a tick bite. Pt saw a small brown tick on her RT lower abdomen. Pt removed it last night. Pt has no complaints. Mild swelling and redness at site.

## 2018-12-04 DIAGNOSIS — W57XXXS Bitten or stung by nonvenomous insect and other nonvenomous arthropods, sequela: Secondary | ICD-10-CM | POA: Diagnosis not present

## 2018-12-04 DIAGNOSIS — M542 Cervicalgia: Secondary | ICD-10-CM | POA: Diagnosis not present

## 2019-03-06 ENCOUNTER — Other Ambulatory Visit (HOSPITAL_COMMUNITY): Payer: Self-pay | Admitting: Internal Medicine

## 2019-03-06 DIAGNOSIS — Z1231 Encounter for screening mammogram for malignant neoplasm of breast: Secondary | ICD-10-CM

## 2019-03-12 DIAGNOSIS — M542 Cervicalgia: Secondary | ICD-10-CM | POA: Diagnosis not present

## 2019-04-09 ENCOUNTER — Ambulatory Visit (HOSPITAL_COMMUNITY): Payer: 59

## 2019-04-10 ENCOUNTER — Ambulatory Visit (HOSPITAL_COMMUNITY)
Admission: RE | Admit: 2019-04-10 | Discharge: 2019-04-10 | Disposition: A | Discharge status: Home / Self Care | Payer: Medicare Other | Source: Ambulatory Visit | Attending: Internal Medicine | Admitting: Internal Medicine

## 2019-04-10 ENCOUNTER — Other Ambulatory Visit: Payer: Self-pay

## 2019-04-10 DIAGNOSIS — Z1231 Encounter for screening mammogram for malignant neoplasm of breast: Secondary | ICD-10-CM | POA: Insufficient documentation

## 2019-06-09 DIAGNOSIS — M542 Cervicalgia: Secondary | ICD-10-CM | POA: Diagnosis not present

## 2019-08-17 ENCOUNTER — Other Ambulatory Visit: Payer: Self-pay

## 2019-08-17 ENCOUNTER — Ambulatory Visit: Payer: Medicare Other | Attending: Internal Medicine

## 2019-08-17 DIAGNOSIS — Z23 Encounter for immunization: Secondary | ICD-10-CM | POA: Insufficient documentation

## 2019-08-17 NOTE — Progress Notes (Signed)
   Covid-19 Vaccination Clinic  Name:  Stacy Chase    MRN: [id number] DOB: 1945-12-30  08/17/2019  [name] was observed post Covid-19 immunization for 15 minutes without incidence. She was provided with Vaccine Information Sheet and instruction to access the V-Safe system.   [name] was instructed to call 911 with any severe reactions post vaccine: Marland Kitchen Difficulty breathing  . Swelling of your face and throat  . A fast heartbeat  . A bad rash all over your body  . Dizziness and weakness    Immunizations Administered    Name Date Dose VIS Date Route   Moderna COVID-19 Vaccine 08/17/2019  1:13 PM 0.5 mL 06/10/2019 Intramuscular   Manufacturer: Moderna   Lot: [id number]   [id number]

## 2019-09-17 ENCOUNTER — Ambulatory Visit: Payer: 59 | Attending: Internal Medicine

## 2019-10-09 DIAGNOSIS — D72819 Decreased white blood cell count, unspecified: Secondary | ICD-10-CM | POA: Diagnosis not present

## 2019-10-09 DIAGNOSIS — K219 Gastro-esophageal reflux disease without esophagitis: Secondary | ICD-10-CM | POA: Diagnosis not present

## 2019-10-09 DIAGNOSIS — M542 Cervicalgia: Secondary | ICD-10-CM | POA: Diagnosis not present

## 2019-10-09 DIAGNOSIS — Z79899 Other long term (current) drug therapy: Secondary | ICD-10-CM | POA: Diagnosis not present

## 2019-10-13 ENCOUNTER — Other Ambulatory Visit (HOSPITAL_COMMUNITY): Payer: Self-pay | Admitting: Internal Medicine

## 2019-10-13 DIAGNOSIS — M4802 Spinal stenosis, cervical region: Secondary | ICD-10-CM | POA: Diagnosis not present

## 2019-10-13 DIAGNOSIS — M542 Cervicalgia: Secondary | ICD-10-CM | POA: Diagnosis not present

## 2019-10-13 DIAGNOSIS — N959 Unspecified menopausal and perimenopausal disorder: Secondary | ICD-10-CM

## 2019-10-13 DIAGNOSIS — E785 Hyperlipidemia, unspecified: Secondary | ICD-10-CM | POA: Diagnosis not present

## 2019-10-13 DIAGNOSIS — N951 Menopausal and female climacteric states: Secondary | ICD-10-CM

## 2019-10-20 ENCOUNTER — Other Ambulatory Visit (HOSPITAL_COMMUNITY): Payer: Self-pay | Admitting: Internal Medicine

## 2019-10-20 ENCOUNTER — Inpatient Hospital Stay (HOSPITAL_COMMUNITY): Admission: RE | Admit: 2019-10-20 | Payer: Medicare Other | Source: Ambulatory Visit

## 2019-10-20 ENCOUNTER — Encounter (HOSPITAL_COMMUNITY): Payer: Self-pay

## 2019-10-20 DIAGNOSIS — Z780 Asymptomatic menopausal state: Secondary | ICD-10-CM

## 2019-12-23 DIAGNOSIS — F0390 Unspecified dementia without behavioral disturbance: Secondary | ICD-10-CM | POA: Diagnosis not present

## 2019-12-23 DIAGNOSIS — Z681 Body mass index (BMI) 19 or less, adult: Secondary | ICD-10-CM | POA: Diagnosis not present

## 2019-12-23 DIAGNOSIS — E0590 Thyrotoxicosis, unspecified without thyrotoxic crisis or storm: Secondary | ICD-10-CM | POA: Diagnosis not present

## 2019-12-23 DIAGNOSIS — R413 Other amnesia: Secondary | ICD-10-CM | POA: Diagnosis not present

## 2019-12-23 DIAGNOSIS — R634 Abnormal weight loss: Secondary | ICD-10-CM | POA: Diagnosis not present

## 2020-08-07 ENCOUNTER — Other Ambulatory Visit: Payer: Self-pay

## 2020-08-07 ENCOUNTER — Emergency Department (HOSPITAL_COMMUNITY): Payer: Medicare Other

## 2020-08-07 ENCOUNTER — Encounter (HOSPITAL_COMMUNITY): Payer: Self-pay | Admitting: Emergency Medicine

## 2020-08-07 ENCOUNTER — Emergency Department (HOSPITAL_COMMUNITY)
Admission: EM | Admit: 2020-08-07 | Discharge: 2020-08-07 | Disposition: A | Discharge status: Home / Self Care | Payer: Medicare Other | Attending: Emergency Medicine | Admitting: Emergency Medicine

## 2020-08-07 DIAGNOSIS — Y9301 Activity, walking, marching and hiking: Secondary | ICD-10-CM | POA: Diagnosis not present

## 2020-08-07 DIAGNOSIS — M25531 Pain in right wrist: Secondary | ICD-10-CM | POA: Insufficient documentation

## 2020-08-07 DIAGNOSIS — Y9289 Other specified places as the place of occurrence of the external cause: Secondary | ICD-10-CM | POA: Diagnosis not present

## 2020-08-07 DIAGNOSIS — W010XXA Fall on same level from slipping, tripping and stumbling without subsequent striking against object, initial encounter: Secondary | ICD-10-CM | POA: Diagnosis not present

## 2020-08-07 NOTE — ED Triage Notes (Signed)
Fell last on ice.  Injury to right wrist, rates pain 5/10.  Denies hitting head.

## 2020-08-07 NOTE — Discharge Instructions (Addendum)
You were seen here after a fall, imaging is negative for fracture but I am concerned you might have a fracture that was undetected by x-ray.  You were placed in a splint I want to keep this on.  You may take over-the-counter pain medications like ibuprofen and or Tylenol every 6 hours as needed please folloow dosing on the back of bottle.  Please keep arm elevated and apply ice to the area as this will help decrease inflammation and swelling.  I would like you to follow-up with Dr. Aline Brochure of orthopedics for further evaluation.  Come back to the emergency department if you develop chest pain, shortness of breath, severe abdominal pain, uncontrolled nausea, vomiting, diarrhea.

## 2020-08-07 NOTE — ED Provider Notes (Signed)
Stacy Chase Provider Note   CSN: [id number] Arrival date & time: 08/07/20  1212     History Chief Complaint  Patient presents with  . Wrist Injury    Right    Stacy Chase is a 75 y.o. female.  HPI   Patient with no significant medical history presents to the emergency department after having a fall proximately 2 weeks ago.  Patient endorses she was walking outside and slipped on ice, she fell backwards and used her right arm to break the fall.  She landed on her right arm and has had pain on her right wrist.  She denies numbness or tingling in that hand, has full range of motion in her fingers, unable to move her wrist, can move at her elbow and shoulder without difficulty.  She denies hitting her head, losing conscious, is not on anticoagulant.  She denies neck or back pain, has no other complaints at this time.  She denies alleviating factors.  Patient denies headaches, fevers, chills, shortness of breath, chest pain, abdominal pain, nausea, vomiting, diarrhea, pedal edema.  Past Medical History:  Diagnosis Date  . Medical history non-contributory     There are no problems to display for this patient.   Past Surgical History:  Procedure Laterality Date  . CATARACT EXTRACTION W/PHACO Left 01/30/2017   Procedure: CATARACT EXTRACTION PHACO AND INTRAOCULAR LENS PLACEMENT (IOC);  Surgeon: Rutherford Guys, MD;  Location: [facility];  Service: Ophthalmology;  Laterality: Left;  CDE: 14.94  . CATARACT EXTRACTION W/PHACO Right 03/06/2017   Procedure: CATARACT EXTRACTION PHACO AND INTRAOCULAR LENS PLACEMENT (IOC);  Surgeon: Rutherford Guys, MD;  Location: [facility];  Service: Ophthalmology;  Laterality: Right;  CDE: 5.93  . CERVICAL POLYPECTOMY    . COLONOSCOPY    . COLONOSCOPY N/A 06/10/2014   Procedure: COLONOSCOPY;  Surgeon: Rogene Houston, MD;  Location: [facility];  Service: Endoscopy;  Laterality: N/A;  930     OB History   No obstetric history on file.      Family History  Problem Relation Age of Onset  . Hypertension Mother   . Diabetes Sister   . Cancer Sister        breast  . Hypertension Sister   . Cancer Brother        throat  . Hypertension Maternal Grandmother   . Hypertension Maternal Grandfather   . Diabetes Sister     Social History   Tobacco Use  . Smoking status: Never Smoker  . Smokeless tobacco: Never Used  Vaping Use  . Vaping Use: Never used  Substance Use Topics  . Alcohol use: No  . Drug use: No    Home Medications Prior to Admission medications   Medication Sig Start Date End Date Taking? Authorizing Provider  triamcinolone cream (KENALOG) 0.1 % Apply 1 application topically 3 (three) times daily. Patient not taking: Reported on 09/18/2018 12/08/17   Kem Parkinson, PA-C    Allergies    Cortisone and Morphine and related  Review of Systems   Review of Systems  Constitutional: Negative for chills and fever.  HENT: Negative for congestion.   Eyes: Negative for visual disturbance.  Respiratory: Negative for shortness of breath.   Cardiovascular: Negative for chest pain.  Gastrointestinal: Negative for abdominal pain, diarrhea, nausea and vomiting.  Genitourinary: Negative for enuresis and flank pain.  Musculoskeletal: Negative for back pain.       Right wrist pain  Skin: Negative for rash.  Neurological: Negative  for headaches.  Hematological: Does not bruise/bleed easily.    Physical Exam Updated Vital Signs BP (!) 115/55 (BP Location: Right Arm)   Pulse 66   Temp (!) 97.5 F (36.4 C) (Oral)   Resp 18   Ht 5\' 10"  (1.778 m)   Wt 59 kg   SpO2 100%   BMI 18.65 kg/m   Physical Exam Vitals and nursing note reviewed.  Constitutional:      General: She is not in acute distress.    Appearance: She is not ill-appearing.  HENT:     Head: Normocephalic and atraumatic.     Nose: No congestion.  Eyes:     Conjunctiva/sclera: Conjunctivae normal.  Cardiovascular:     Rate and Rhythm:  Normal rate and regular rhythm.     Pulses: Normal pulses.  Pulmonary:     Effort: Pulmonary effort is normal.  Musculoskeletal:        General: Swelling and tenderness present. No deformity.     Cervical back: No rigidity.     Comments: Patient spine was palpated it was nontender to palpation, no step-off or deformity is present.  Patient's right wrist was visualized, it was swollen, not erythematous, no lacerations, abrasions ecchymosis or other gross abnormalities noted.  She had full range of motion in her fingers, had limited range of motion at her wrist in regards to flexion and extension.  She had full range of motion at her elbow and shoulder.  She was tender to palpation at the distal end of her radius around her anatomical snuffbox, there is no deformities or crepitus present.  Neurovascular fully intact.  Skin:    General: Skin is warm and dry.  Neurological:     Mental Status: She is alert.  Psychiatric:        Mood and Affect: Mood normal.     ED Results / Procedures / Treatments   Labs (all labs ordered are listed, but only abnormal results are displayed) Labs Reviewed - No data to display  EKG None  Radiology DG Wrist Complete Right  Result Date: 08/07/2020 CLINICAL DATA:  Fall on ice with right wrist injury. EXAM: RIGHT WRIST - COMPLETE 3+ VIEW COMPARISON:  None. FINDINGS: Well corticated ossicle adjacent to the ulnar styloid, not thought to be acute. Mild bony demineralization. Mild spurring along the radial styloid. Mild spurring at the first carpometacarpal articulation. Normal appearance of the pronator fat pad and scapholunate angle. No discrete fracture is identified. IMPRESSION: 1. No acute bony findings. If the patient's tenderness is in the vicinity of the scaphoid/anatomic snuffbox, then cross-sectional imaging or presumptive treatment for occult scaphoid fracture might be considered. 2. Mild spurring at the first carpometacarpal articulation. 3. Well  corticated ossicle adjacent to the ulnar styloid, likely from remote injury. Electronically Signed   By: Van Clines M.D.   On: 08/07/2020 13:49    Procedures Procedures   Medications Ordered in ED Medications - No data to display  ED Course  I have reviewed the triage vital signs and the nursing notes.  Pertinent labs & imaging results that were available during my care of the patient were reviewed by me and considered in my medical decision making (see chart for details).    MDM Rules/Calculators/A&P                          Patient presents after a fall.  She is alert, does not appear acute distress, vital signs reassuring.  Concern for fracture of the radius, ulna, metacarpals.  Will obtain x-ray for further evaluation.  Imaging was negative for acute bony findings but recommends treatment if there is tenderness along the scaphoid.   will go ahead and place patient in a thumb spica to treat her for possible scaphoid fracture.  will have her follow-up with Dr. Aline Brochure for further evaluation.  low suspicion for ligament or tendon damage as area was palpated no gross defects noted, she had full range of motion at all joints in her fingers with limited range of motion at her wrist.  I suspect this is secondary due to pain.  Low suspicion for compartment syndrome as area was palpated it was soft to the touch, neurovascular fully intact.  Even though x-ray is negative for fracture cannot fully exclude possible scaphoid fracture.  Will place patient in a thumb spica and have her follow-up with Dr. Aline Brochure for further evaluation.  Vital signs have remained stable, no indication for hospital admission.  Patient discussed with attending and they agreed with assessment and plan.  Patient given at home care as well strict return precautions.  Patient verbalized that they understood agreed to said plan.     Final Clinical Impression(s) / ED Diagnoses Final diagnoses:  Right wrist  pain    Rx / DC Orders ED Discharge Orders    None       Marcello Fennel, PA-C 08/07/20 1418    Davonna Belling, MD 08/07/20 1622

## 2020-12-18 ENCOUNTER — Emergency Department (HOSPITAL_COMMUNITY)
Admission: EM | Admit: 2020-12-18 | Discharge: 2020-12-18 | Disposition: A | Discharge status: Home / Self Care | Payer: Medicare Other | Attending: Emergency Medicine | Admitting: Emergency Medicine

## 2020-12-18 ENCOUNTER — Other Ambulatory Visit: Payer: Self-pay

## 2020-12-18 DIAGNOSIS — R220 Localized swelling, mass and lump, head: Secondary | ICD-10-CM | POA: Diagnosis not present

## 2020-12-18 DIAGNOSIS — L259 Unspecified contact dermatitis, unspecified cause: Secondary | ICD-10-CM | POA: Diagnosis not present

## 2020-12-18 DIAGNOSIS — L255 Unspecified contact dermatitis due to plants, except food: Secondary | ICD-10-CM | POA: Insufficient documentation

## 2020-12-18 DIAGNOSIS — R21 Rash and other nonspecific skin eruption: Secondary | ICD-10-CM | POA: Diagnosis present

## 2020-12-18 MED ORDER — PREDNISONE 50 MG PO TABS: 50.0000 mg | ORAL_TABLET | Freq: Every day | ORAL | 0 refills | Status: DC

## 2020-12-18 MED ORDER — LORATADINE 10 MG PO TABS
10.0000 mg | ORAL_TABLET | Freq: Once | ORAL | Status: AC
  Administered 2020-12-18: 10 mg via ORAL
  Filled 2020-12-18: qty 1

## 2020-12-18 MED ORDER — PREDNISONE 50 MG PO TABS
60.0000 mg | ORAL_TABLET | Freq: Once | ORAL | Status: AC
  Administered 2020-12-18: 60 mg via ORAL
  Filled 2020-12-18: qty 1

## 2020-12-18 NOTE — ED Triage Notes (Signed)
Patient states she was working in the yard yesterday and woke up this am with bilateral swollen eyes with discharge. Patient states every spring this happens to her. Denies nay other symptoms or vision changes.

## 2020-12-18 NOTE — Discharge Instructions (Addendum)
You were seen in the emergency department for swelling and discharge around your eyes.  This may be allergies or contact dermatitis from working with plant material outside.  Please use a cool compress to your face.  Allergy medicine such as loratadine.  4 more days of prednisone.  Follow-up with your doctor.  Return to the emergency department if any worsening or concerning symptoms.  You should also have your vision checked by the eye doctor.

## 2020-12-18 NOTE — ED Provider Notes (Signed)
[facility] EMERGENCY DEPARTMENT Provider Note   CSN: [id number] Arrival date & time: 12/18/20  1012     History Chief Complaint  Patient presents with   Eye Drainage    Stacy Chase is a 75 y.o. female.  She is here for swelling around her eyes and face along with eye discharge when she woke up this morning.  She said she was in the yard doing a lot of weeding yesterday.  She says she gets this in the springtime sometimes.  No vision change.  No trouble breathing or swallowing.  No other rashes.  The history is provided by the patient.  Rash Location:  Face Quality: redness and swelling   Severity:  Moderate Duration:  5 hours Timing:  Constant Progression:  Unchanged Chronicity:  Recurrent Context: plant contact and sun exposure   Relieved by:  None tried Worsened by:  Nothing Ineffective treatments:  None tried Associated symptoms: periorbital edema   Associated symptoms: no abdominal pain, no fever, no headaches, no hoarse voice, no nausea, no shortness of breath, no sore throat, no throat swelling, no tongue swelling, not vomiting and not wheezing       Past Medical History:  Diagnosis Date   Medical history non-contributory     There are no problems to display for this patient.   Past Surgical History:  Procedure Laterality Date   CATARACT EXTRACTION W/PHACO Left 01/30/2017   Procedure: CATARACT EXTRACTION PHACO AND INTRAOCULAR LENS PLACEMENT (IOC);  Surgeon: Rutherford Guys, MD;  Location: [facility];  Service: Ophthalmology;  Laterality: Left;  CDE: 14.94   CATARACT EXTRACTION W/PHACO Right 03/06/2017   Procedure: CATARACT EXTRACTION PHACO AND INTRAOCULAR LENS PLACEMENT (IOC);  Surgeon: Rutherford Guys, MD;  Location: [facility];  Service: Ophthalmology;  Laterality: Right;  CDE: 5.93   CERVICAL POLYPECTOMY     COLONOSCOPY     COLONOSCOPY N/A 06/10/2014   Procedure: COLONOSCOPY;  Surgeon: Rogene Houston, MD;  [facility]: [facility];  Service: Endoscopy;  Laterality: N/A;   930     OB History   No obstetric history on file.     Family History  Problem Relation Age of Onset   Hypertension Mother    Diabetes Sister    Cancer Sister        breast   Hypertension Sister    Cancer Brother        throat   Hypertension Maternal Grandmother    Hypertension Maternal Grandfather    Diabetes Sister     Social History   Tobacco Use   Smoking status: Never   Smokeless tobacco: Never  Vaping Use   Vaping Use: Never used  Substance Use Topics   Alcohol use: No   Drug use: No    Home Medications Prior to Admission medications   Medication Sig Start Date End Date Taking? Authorizing Provider  triamcinolone cream (KENALOG) 0.1 % Apply 1 application topically 3 (three) times daily. Patient not taking: Reported on 09/18/2018 12/08/17   Kem Parkinson, PA-C    Allergies    Cortisone, Morphine, and Morphine and related  Review of Systems   Review of Systems  Constitutional:  Negative for fever.  HENT:  Negative for hoarse voice and sore throat.   Eyes:  Positive for discharge. Negative for visual disturbance.  Respiratory:  Negative for shortness of breath and wheezing.   Gastrointestinal:  Negative for abdominal pain, nausea and vomiting.  Skin:  Positive for rash.  Neurological:  Negative for headaches.  Physical Exam Updated Vital Signs BP 137/66   Pulse (!) 51   Temp 98.1 F (36.7 C) (Oral)   Resp 18   Ht 5' 10.5" (1.791 m)   Wt 56.7 kg   SpO2 100%   BMI 17.68 kg/m   Physical Exam Vitals and nursing note reviewed.  Constitutional:      General: She is not in acute distress.    Appearance: She is well-developed.  HENT:     Head: Normocephalic and atraumatic.  Eyes:     Comments: There is marked swelling around her eyes and lids.  Cardiovascular:     Rate and Rhythm: Normal rate and regular rhythm.     Heart sounds: No murmur heard. Pulmonary:     Effort: Pulmonary effort is normal. No respiratory distress.     Breath sounds:  Normal breath sounds.  Abdominal:     Palpations: Abdomen is soft.     Tenderness: There is no abdominal tenderness.  Musculoskeletal:        General: No deformity or signs of injury. Normal range of motion.     Cervical back: Neck supple.  Skin:    General: Skin is warm and dry.  Neurological:     General: No focal deficit present.     Mental Status: She is alert.    ED Results / Procedures / Treatments   Labs (all labs ordered are listed, but only abnormal results are displayed) Labs Reviewed - No data to display  EKG None  Radiology No results found.  Procedures Procedures   Medications Ordered in ED Medications - No data to display  ED Course  I have reviewed the triage vital signs and the nursing notes.  Pertinent labs & imaging results that were available during my care of the patient were reviewed by me and considered in my medical decision making (see chart for details).    MDM Rules/Calculators/A&P                         Differential diagnosis includes allergic reaction, contact dermatitis, conjunctivitis, periorbital cellulitis.  Will treat with steroids and antihistamines.  Cool compress.  Recommended close follow-up with PCP and return instructions discussed.  Final Clinical Impression(s) / ED Diagnoses Final diagnoses:  Facial swelling  Contact dermatitis, unspecified contact dermatitis type, unspecified trigger    Rx / DC Orders ED Discharge Orders          Ordered    predniSONE (DELTASONE) 50 MG tablet  Daily        12/18/20 1230             Hayden Rasmussen, MD 12/18/20 1711

## 2021-03-18 ENCOUNTER — Other Ambulatory Visit: Payer: Self-pay | Admitting: Internal Medicine

## 2021-03-18 ENCOUNTER — Other Ambulatory Visit (HOSPITAL_COMMUNITY): Payer: Self-pay | Admitting: Internal Medicine

## 2021-03-18 DIAGNOSIS — Z79899 Other long term (current) drug therapy: Secondary | ICD-10-CM | POA: Diagnosis not present

## 2021-03-18 DIAGNOSIS — D72819 Decreased white blood cell count, unspecified: Secondary | ICD-10-CM | POA: Diagnosis not present

## 2021-03-18 DIAGNOSIS — M542 Cervicalgia: Secondary | ICD-10-CM | POA: Diagnosis not present

## 2021-03-18 DIAGNOSIS — R4189 Other symptoms and signs involving cognitive functions and awareness: Secondary | ICD-10-CM

## 2021-03-18 DIAGNOSIS — E785 Hyperlipidemia, unspecified: Secondary | ICD-10-CM | POA: Diagnosis not present

## 2021-03-18 DIAGNOSIS — K219 Gastro-esophageal reflux disease without esophagitis: Secondary | ICD-10-CM | POA: Diagnosis not present

## 2021-03-29 ENCOUNTER — Other Ambulatory Visit: Payer: Self-pay

## 2021-03-29 ENCOUNTER — Ambulatory Visit (HOSPITAL_COMMUNITY)
Admission: RE | Admit: 2021-03-29 | Discharge: 2021-03-29 | Disposition: A | Discharge status: Home / Self Care | Payer: Medicare Other | Source: Ambulatory Visit | Attending: Internal Medicine | Admitting: Internal Medicine

## 2021-03-29 DIAGNOSIS — R4189 Other symptoms and signs involving cognitive functions and awareness: Secondary | ICD-10-CM | POA: Diagnosis not present

## 2021-03-29 DIAGNOSIS — G319 Degenerative disease of nervous system, unspecified: Secondary | ICD-10-CM | POA: Diagnosis not present

## 2021-03-29 DIAGNOSIS — R531 Weakness: Secondary | ICD-10-CM | POA: Diagnosis not present

## 2021-03-29 DIAGNOSIS — R42 Dizziness and giddiness: Secondary | ICD-10-CM | POA: Diagnosis not present

## 2022-02-09 ENCOUNTER — Other Ambulatory Visit: Payer: Self-pay

## 2022-02-09 ENCOUNTER — Emergency Department (HOSPITAL_COMMUNITY)
Admission: EM | Admit: 2022-02-09 | Discharge: 2022-02-09 | Disposition: A | Discharge status: Home / Self Care | Payer: Medicare Other | Attending: Student | Admitting: Student

## 2022-02-09 ENCOUNTER — Encounter (HOSPITAL_COMMUNITY): Payer: Self-pay

## 2022-02-09 DIAGNOSIS — H1033 Unspecified acute conjunctivitis, bilateral: Secondary | ICD-10-CM | POA: Diagnosis not present

## 2022-02-09 DIAGNOSIS — H1013 Acute atopic conjunctivitis, bilateral: Secondary | ICD-10-CM | POA: Diagnosis not present

## 2022-02-09 MED ORDER — KETOROLAC TROMETHAMINE 0.5 % OP SOLN
1.0000 [drp] | Freq: Once | OPHTHALMIC | Status: AC
  Administered 2022-02-09: 1 [drp] via OPHTHALMIC
  Filled 2022-02-09: qty 5

## 2022-02-09 MED ORDER — TETRACAINE HCL 0.5 % OP SOLN
2.0000 [drp] | Freq: Once | OPHTHALMIC | Status: AC
  Administered 2022-02-09: 2 [drp] via OPHTHALMIC
  Filled 2022-02-09: qty 4

## 2022-02-09 MED ORDER — FLUORESCEIN SODIUM 1 MG OP STRP
2.0000 | ORAL_STRIP | Freq: Once | OPHTHALMIC | Status: AC
  Administered 2022-02-09: 2 via OPHTHALMIC
  Filled 2022-02-09: qty 2

## 2022-02-09 NOTE — Discharge Instructions (Addendum)
Cool compresses on and off to your eyes, avoid rubbing  As this can cause your eyelids to swell.  Apply 1 drop of the ketorolac to each eye 3 times a day for the next 2 to 3 days then discontinue.  Follow-up with the eye doctor listed if needed.  Return to the emergency department for any new or worsening symptoms

## 2022-02-09 NOTE — ED Provider Notes (Signed)
[facility] Provider Note   CSN: [id number] Arrival date & time: 02/09/22  1239     History  Chief Complaint  Patient presents with   Eye Problem    itching    Stacy Chase is a 76 y.o. female.   Eye Problem Associated symptoms: itching   Associated symptoms: no discharge, no headaches, no nausea, no numbness, no photophobia, no redness, no vomiting and no weakness        Stacy Chase is a 76 y.o. female who presents to the Emergency Department complaining of itching of both eyes.  States that she was working outside in her yard one day prior to arrival and shortly after she began having itching of her eyes.  She washed her face, but has not tried any OTC eye drops or flushed her eyes.  She denies eye pain, visual changes, excessive tearing or discharge dizziness, facial swelling or headache.       Home Medications Prior to Admission medications   Medication Sig Start Date End Date Taking? Authorizing Provider  predniSONE (DELTASONE) 50 MG tablet Take 1 tablet (50 mg total) by mouth daily. 12/18/20   Hayden Rasmussen, MD  triamcinolone cream (KENALOG) 0.1 % Apply 1 application topically 3 (three) times daily. Patient not taking: Reported on 09/18/2018 12/08/17   Kem Parkinson, PA-C      Allergies    Cortisone, Morphine, and Morphine and related    Review of Systems   Review of Systems  Constitutional:  Negative for activity change and appetite change.  HENT:  Negative for congestion, ear pain, facial swelling, sore throat and trouble swallowing.   Eyes:  Positive for itching. Negative for photophobia, pain, discharge, redness and visual disturbance.  Respiratory:  Negative for cough, shortness of breath and wheezing.   Gastrointestinal:  Negative for nausea and vomiting.  Neurological:  Negative for dizziness, facial asymmetry, weakness, light-headedness, numbness and headaches.    Physical Exam Updated Vital Signs BP (!) 149/85 (BP Location:  Right Arm)   Pulse 61   Temp 97.6 F (36.4 C) (Oral)   Resp 18   Ht 5' 10.5" (1.791 m)   Wt 56.7 kg   SpO2 100%   BMI 17.68 kg/m  Physical Exam Vitals and nursing note reviewed.  Constitutional:      General: She is not in acute distress.    Appearance: Normal appearance. She is not ill-appearing or toxic-appearing.  HENT:     Right Ear: Tympanic membrane and ear canal normal.     Left Ear: Tympanic membrane and ear canal normal.     Nose: Nose normal. No congestion or rhinorrhea.     Mouth/Throat:     Mouth: Mucous membranes are moist.  Eyes:     General: Lids are everted, no foreign bodies appreciated. Vision grossly intact. Gaze aligned appropriately.        Right eye: No foreign body, discharge or hordeolum.        Left eye: No foreign body, discharge or hordeolum.     Extraocular Movements: Extraocular movements intact.     Conjunctiva/sclera: Conjunctivae normal.     Right eye: Right conjunctiva is not injected. No chemosis.    Left eye: Left conjunctiva is not injected. No chemosis.    Pupils: Pupils are equal, round, and reactive to light.     Right eye: No corneal abrasion or fluorescein uptake. Seidel exam negative.     Left eye: No corneal abrasion or fluorescein uptake.  Seidel exam negative.    Funduscopic exam:    Right eye: No papilledema.        Left eye: No papilledema.     Slit lamp exam:    Right eye: Anterior chamber quiet.     Left eye: Anterior chamber quiet.     Comments: Mild edema of the bilateral upper lids.  No erythema, facial tenderness.    Cardiovascular:     Rate and Rhythm: Normal rate and regular rhythm.     Pulses: Normal pulses.  Pulmonary:     Effort: Pulmonary effort is normal.     Breath sounds: Normal breath sounds.  Musculoskeletal:        General: Normal range of motion.     Cervical back: Normal range of motion.  Neurological:     General: No focal deficit present.     Mental Status: She is alert and oriented to person, place,  and time.     Sensory: No sensory deficit.     Motor: No weakness.     ED Results / Procedures / Treatments   Labs (all labs ordered are listed, but only abnormal results are displayed) Labs Reviewed - No data to display  EKG None  Radiology No results found.  Procedures Procedures    Medications Ordered in ED Medications  fluorescein ophthalmic strip 2 strip (2 strips Both Eyes Given 02/09/22 1419)  tetracaine (PONTOCAINE) 0.5 % ophthalmic solution 2 drop (2 drops Both Eyes Given 02/09/22 1419)  ketorolac (ACULAR) 0.5 % ophthalmic solution 1 drop (1 drop Both Eyes Given 02/09/22 1538)    ED Course/ Medical Decision Making/ A&P                           Medical Decision Making Patient here for evaluation of itching of the lateral eyes.  Symptoms began after working outside.  She does have some mild edema of the bilateral upper eyelids but no other facial edema or erythema noted.  No reported visual changes headache or dizziness.  Amount and/or Complexity of Data Reviewed Discussion of management or test interpretation with external provider(s): Patient here with likely allergic conjunctivitis.  No evidence of corneal abrasion, ulceration, or infectious process.  Slit-lamp exam unremarkable  Both eyes were irrigated with saline flush.  Patient denies any symptoms at this time.  Patient appears appropriate for discharge home, ketorolac drops dispensed for home use.  She will follow-up closely with ophthalmology.  Return precautions discussed.  Risk Prescription drug management.     Visual Acuity Bilateral Distance: 20/10 R Distance: 20/13 L Distance: 20/10         Final Clinical Impression(s) / ED Diagnoses Final diagnoses:  Allergic conjunctivitis of both eyes    Rx / DC Orders ED Discharge Orders     None         Kem Parkinson, PA-C 02/11/22 [place], [place], MD 02/13/22 [phone]

## 2022-02-09 NOTE — ED Triage Notes (Signed)
Right eye irritation. Was in the garden yesterday and got weeds in her eye.

## 2022-04-24 ENCOUNTER — Other Ambulatory Visit: Payer: Self-pay

## 2022-04-24 ENCOUNTER — Ambulatory Visit (HOSPITAL_COMMUNITY)
Admission: RE | Admit: 2022-04-24 | Discharge: 2022-04-24 | Disposition: A | Discharge status: Home / Self Care | Payer: Medicare Other | Source: Ambulatory Visit | Attending: Nurse Practitioner | Admitting: Nurse Practitioner

## 2022-04-24 ENCOUNTER — Ambulatory Visit
Admission: EM | Admit: 2022-04-24 | Discharge: 2022-04-24 | Disposition: A | Discharge status: Home / Self Care | Payer: Medicare Other | Attending: Nurse Practitioner | Admitting: Nurse Practitioner

## 2022-04-24 ENCOUNTER — Encounter: Payer: Self-pay | Admitting: *Deleted

## 2022-04-24 DIAGNOSIS — R0789 Other chest pain: Secondary | ICD-10-CM

## 2022-04-24 DIAGNOSIS — W19XXXA Unspecified fall, initial encounter: Secondary | ICD-10-CM | POA: Diagnosis not present

## 2022-04-24 MED ORDER — TIZANIDINE HCL 4 MG PO TABS: 4.0000 mg | ORAL_TABLET | Freq: Every evening | ORAL | 0 refills | Status: DC | PRN

## 2022-04-24 NOTE — ED Provider Notes (Signed)
[facility]    CSN: [id number] Arrival date & time: 04/24/22  1103      History   Chief Complaint Chief Complaint  Patient presents with   Fall    HPI Stacy Chase is a 76 y.o. female.   Patient presents with pain in the middle of her chest that began after she fell yesterday.  Reports she was walking through an open door, stubbed her toe, and fell onto her outstretched hands.  She denies falling directly onto her chest or trunk.  Reports her hands caught the fall.  She denies hand, wrist, elbow, or shoulder pain today.  There is no leg or foot pain today.  Reports she only has pain in her chest when she touches over the the area that is painful.  She denies redness, bruising, or swelling.  Has not taken anything for the pain so far.   Past Medical History:  Diagnosis Date   Medical history non-contributory     There are no problems to display for this patient.   Past Surgical History:  Procedure Laterality Date   CATARACT EXTRACTION W/PHACO Left 01/30/2017   Procedure: CATARACT EXTRACTION PHACO AND INTRAOCULAR LENS PLACEMENT (IOC);  Surgeon: Rutherford Guys, MD;  Location: [facility];  Service: Ophthalmology;  Laterality: Left;  CDE: 14.94   CATARACT EXTRACTION W/PHACO Right 03/06/2017   Procedure: CATARACT EXTRACTION PHACO AND INTRAOCULAR LENS PLACEMENT (IOC);  Surgeon: Rutherford Guys, MD;  Location: [facility];  Service: Ophthalmology;  Laterality: Right;  CDE: 5.93   CERVICAL POLYPECTOMY     COLONOSCOPY     COLONOSCOPY N/A 06/10/2014   Procedure: COLONOSCOPY;  Surgeon: Rogene Houston, MD;  Location: [facility];  Service: Endoscopy;  Laterality: N/A;  930    OB History   No obstetric history on file.      Home Medications    Prior to Admission medications   Medication Sig Start Date End Date Taking? Authorizing Provider  tiZANidine (ZANAFLEX) 4 MG tablet Take 1 tablet (4 mg total) by mouth at bedtime as needed for muscle spasms. Do not take with alcohol or  while driving or operating heavy machinery.  May cause drowsiness. 04/24/22  Yes Eulogio Bear, NP    Family History Family History  Problem Relation Age of Onset   Hypertension Mother    Diabetes Sister    Cancer Sister        breast   Hypertension Sister    Cancer Brother        throat   Hypertension Maternal Grandmother    Hypertension Maternal Grandfather    Diabetes Sister     Social History Social History   Tobacco Use   Smoking status: Never   Smokeless tobacco: Never  Vaping Use   Vaping Use: Never used  Substance Use Topics   Alcohol use: No   Drug use: No     Allergies   Cortisone, Morphine, and Morphine and related   Review of Systems Review of Systems Per HPI  Physical Exam Triage Vital Signs ED Triage Vitals  Enc Vitals Group     BP 04/24/22 1138 132/69     Pulse Rate 04/24/22 1138 62     Resp 04/24/22 1138 18     Temp 04/24/22 1138 97.9 F (36.6 C)     Temp Source 04/24/22 1138 Oral     SpO2 04/24/22 1138 96 %     Weight --      Height --  Head Circumference --      Peak Flow --      Pain Score 04/24/22 1137 3     Pain Loc --      Pain Edu? --      Excl. in [place]? --    No data found.  Updated Vital Signs BP 132/69 (BP Location: Right Arm)   Pulse 62   Temp 97.9 F (36.6 C) (Oral)   Resp 18   SpO2 96%   Visual Acuity Right Eye Distance:   Left Eye Distance:   Bilateral Distance:    Right Eye Near:   Left Eye Near:    Bilateral Near:     Physical Exam Vitals and nursing note reviewed.  Constitutional:      General: She is not in acute distress.    Appearance: Normal appearance. She is not toxic-appearing.  HENT:     Right Ear: Tympanic membrane, ear canal and external ear normal. There is no impacted cerumen.     Left Ear: Tympanic membrane, ear canal and external ear normal. There is no impacted cerumen.     Mouth/Throat:     Mouth: Mucous membranes are moist.     Pharynx: Oropharynx is clear. No  oropharyngeal exudate or posterior oropharyngeal erythema.  Cardiovascular:     Rate and Rhythm: Normal rate and regular rhythm.  Pulmonary:     Effort: Pulmonary effort is normal. No respiratory distress.     Breath sounds: Normal breath sounds. No wheezing, rhonchi or rales.  Chest:     Chest wall: Tenderness present. No mass, swelling or edema. There is no dullness to percussion.    Musculoskeletal:     Cervical back: Normal range of motion. No rigidity or tenderness.  Lymphadenopathy:     Cervical: No cervical adenopathy.  Skin:    General: Skin is warm and dry.     Capillary Refill: Capillary refill takes less than 2 seconds.     Coloration: Skin is not jaundiced or pale.     Findings: No bruising, erythema or rash.  Neurological:     Mental Status: She is alert and oriented to person, place, and time.  Psychiatric:        Behavior: Behavior is cooperative.     UC Treatments / Results  Labs (all labs ordered are listed, but only abnormal results are displayed) Labs Reviewed - No data to display  EKG   Radiology No results found.  Procedures Procedures (including critical care time)  Medications Ordered in UC Medications - No data to display  Initial Impression / Assessment and Plan / UC Course  I have reviewed the triage vital signs and the nursing notes.  Pertinent labs & imaging results that were available during my care of the patient were reviewed by me and considered in my medical decision making (see chart for details).   Patient is well-appearing, normotensive, afebrile, not tachycardic, not tachypneic, oxygenating well on room air.    Fall, initial encounter Chest wall pain Suspect costochondritis EKG today unremarkable when compared with EKG from 2018 Only has pain with palpation of sternum We will obtain chest x-ray to rule out acute fracture, although there is no bruising, redness, or swelling or deformity (chest x-ray will be done as an  outpatient since we do not have an x-ray tech today) Discussed Tylenol, use of tizanidine sparingly as needed for muscle pain Follow-up with primary care provider with no improvement in symptoms If symptoms worsen, go to ER  The patient was given the opportunity to ask questions.  All questions answered to their satisfaction.  The patient is in agreement to this plan.     Final Clinical Impressions(s) / UC Diagnoses   Final diagnoses:  Fall, initial encounter  Chest wall pain     Discharge Instructions      The EKG today looks similar when compared with the previous EKG that is in your chart  After you leave urgent care today, please go to [facility] imaging department and have the chest x-ray done to check for abnormalities in your chest wall bones  The pain in your chest is most likely coming from the small muscles in your rib cage being irritated since the fall yesterday.  Please start taking Tylenol [phone] mg every 6 hours as needed for pain.  If this is not helpful, at night time, you can take the tizanidine to help with muscle spasms.   Follow up with PCP with no improvement in symptoms with this treatment.    If you develop chest pain that is constant, go to the Emergency Room.      ED Prescriptions     Medication Sig Dispense Auth. Provider   tiZANidine (ZANAFLEX) 4 MG tablet Take 1 tablet (4 mg total) by mouth at bedtime as needed for muscle spasms. Do not take with alcohol or while driving or operating heavy machinery.  May cause drowsiness. 30 tablet Eulogio Bear, NP      PDMP not reviewed this encounter.   Eulogio Bear, NP 04/24/22 1714

## 2022-04-24 NOTE — ED Triage Notes (Signed)
Pt states she fell yesterday and now her chest hurts. She does complain of being dizzy starting yesterday. The dizziness isnt consistent it comes and goes.

## 2022-04-24 NOTE — Discharge Instructions (Addendum)
The EKG today looks similar when compared with the previous EKG that is in your chart  After you leave urgent care today, please go to [facility] imaging department and have the chest x-ray done to check for abnormalities in your chest wall bones  The pain in your chest is most likely coming from the small muscles in your rib cage being irritated since the fall yesterday.  Please start taking Tylenol [phone] mg every 6 hours as needed for pain.  If this is not helpful, at night time, you can take the tizanidine to help with muscle spasms.   Follow up with PCP with no improvement in symptoms with this treatment.    If you develop chest pain that is constant, go to the Emergency Room.

## 2022-10-24 DIAGNOSIS — E0500 Thyrotoxicosis with diffuse goiter without thyrotoxic crisis or storm: Secondary | ICD-10-CM | POA: Diagnosis not present

## 2022-10-24 DIAGNOSIS — E440 Moderate protein-calorie malnutrition: Secondary | ICD-10-CM | POA: Diagnosis not present

## 2022-12-22 DIAGNOSIS — R634 Abnormal weight loss: Secondary | ICD-10-CM | POA: Diagnosis not present

## 2022-12-22 DIAGNOSIS — G3184 Mild cognitive impairment, so stated: Secondary | ICD-10-CM | POA: Diagnosis not present

## 2023-03-28 DIAGNOSIS — Z23 Encounter for immunization: Secondary | ICD-10-CM | POA: Diagnosis not present

## 2023-03-28 DIAGNOSIS — R634 Abnormal weight loss: Secondary | ICD-10-CM | POA: Diagnosis not present

## 2023-04-26 ENCOUNTER — Encounter (HOSPITAL_COMMUNITY): Payer: Self-pay | Admitting: Emergency Medicine

## 2023-04-26 ENCOUNTER — Ambulatory Visit
Admission: EM | Admit: 2023-04-26 | Discharge: 2023-04-26 | Disposition: A | Discharge status: Home / Self Care | Payer: 59 | Attending: Nurse Practitioner | Admitting: Nurse Practitioner

## 2023-04-26 ENCOUNTER — Encounter: Payer: Self-pay | Admitting: Emergency Medicine

## 2023-04-26 ENCOUNTER — Other Ambulatory Visit: Payer: Self-pay

## 2023-04-26 ENCOUNTER — Emergency Department (HOSPITAL_COMMUNITY)
Admission: EM | Admit: 2023-04-26 | Discharge: 2023-04-26 | Disposition: A | Discharge status: Home / Self Care | Payer: Medicare Other

## 2023-04-26 DIAGNOSIS — N390 Urinary tract infection, site not specified: Secondary | ICD-10-CM | POA: Insufficient documentation

## 2023-04-26 DIAGNOSIS — I951 Orthostatic hypotension: Secondary | ICD-10-CM | POA: Diagnosis not present

## 2023-04-26 DIAGNOSIS — R82998 Other abnormal findings in urine: Secondary | ICD-10-CM | POA: Diagnosis not present

## 2023-04-26 DIAGNOSIS — B9689 Other specified bacterial agents as the cause of diseases classified elsewhere: Secondary | ICD-10-CM | POA: Diagnosis not present

## 2023-04-26 DIAGNOSIS — R42 Dizziness and giddiness: Secondary | ICD-10-CM | POA: Diagnosis not present

## 2023-04-26 DIAGNOSIS — R531 Weakness: Secondary | ICD-10-CM | POA: Insufficient documentation

## 2023-04-26 LAB — CBC
HCT: 37.8 % (ref 36.0–46.0)
Hemoglobin: 12.1 g/dL (ref 12.0–15.0)
MCH: 30.3 pg (ref 26.0–34.0)
MCHC: 32 g/dL (ref 30.0–36.0)
MCV: 94.5 fL (ref 80.0–100.0)
Platelets: 221 10*3/uL (ref 150–400)
RBC: 4 MIL/uL (ref 3.87–5.11)
RDW: 13.6 % (ref 11.5–15.5)
WBC: 5.8 10*3/uL (ref 4.0–10.5)
nRBC: 0 % (ref 0.0–0.2)

## 2023-04-26 LAB — URINALYSIS, ROUTINE W REFLEX MICROSCOPIC
Bilirubin Urine: NEGATIVE
Glucose, UA: NEGATIVE mg/dL
Hgb urine dipstick: NEGATIVE
Ketones, ur: NEGATIVE mg/dL
Nitrite: NEGATIVE
Protein, ur: 30 mg/dL — AB
Specific Gravity, Urine: 1.018 (ref 1.005–1.030)
pH: 6 (ref 5.0–8.0)

## 2023-04-26 LAB — BASIC METABOLIC PANEL
Anion gap: 10 (ref 5–15)
BUN: 23 mg/dL (ref 8–23)
CO2: 27 mmol/L (ref 22–32)
Calcium: 9.6 mg/dL (ref 8.9–10.3)
Chloride: 100 mmol/L (ref 98–111)
Creatinine, Ser: 0.83 mg/dL (ref 0.44–1.00)
GFR, Estimated: >60 mL/min (ref >60)
Glucose, Bld: 106 mg/dL — ABNORMAL HIGH (ref 70–99)
Potassium: 4.1 mmol/L (ref 3.5–5.1)
Sodium: 137 mmol/L (ref 135–145)

## 2023-04-26 LAB — CBG MONITORING, ED: Glucose-Capillary: 77 mg/dL (ref 70–99)

## 2023-04-26 LAB — TROPONIN I (HIGH SENSITIVITY): Troponin I (High Sensitivity): 13 ng/L (ref <18)

## 2023-04-26 MED ORDER — CEPHALEXIN 500 MG PO CAPS: 500.0000 mg | ORAL_CAPSULE | Freq: Four times a day (QID) | ORAL | 0 refills | Status: AC

## 2023-04-26 NOTE — ED Notes (Signed)
Patient is being discharged from the Urgent Care and sent to the Emergency Department via private vehicle . Per NP, patient is in need of higher level of care due to weakness and dizziness. Patient is aware and verbalizes understanding of plan of care.  Vitals:   04/26/23 1018  BP: 128/76  Pulse: 67  Resp: 20  Temp: (!) 95.8 F (35.4 C)  SpO2: 98%

## 2023-04-26 NOTE — ED Provider Triage Note (Signed)
Emergency Medicine Provider Triage Evaluation Note  Stacy Chase , a 77 y.o. female  was evaluated in triage.  Pt complains of weakness  Review of Systems  Positive: cough Negative: fever  Physical Exam  BP 110/69   Pulse 75   Temp (!) 96.8 F (36 C) (Temporal)   Resp 16   Ht 5' 10.5" (1.791 m)   Wt 56.7 kg   SpO2 100%   BMI 17.68 kg/m  Gen:   Awake, no distress   Resp:  Normal effort  MSK:   Moves extremities without difficulty Other:    Medical Decision Making  Medically screening exam initiated at 1:14 PM.  Appropriate orders placed.  Stacy Chase was informed that the remainder of the evaluation will be completed by another provider, this initial triage assessment does not replace that evaluation, and the importance of remaining in the ED until their evaluation is complete.     Stacy Chase, [place] 04/26/23 1315

## 2023-04-26 NOTE — Discharge Instructions (Signed)
Go to the emergency department for further evaluation

## 2023-04-26 NOTE — ED Notes (Signed)
Patient tolerating water and crackers now

## 2023-04-26 NOTE — ED Triage Notes (Addendum)
Per pt sent from UC for evaluation of weakness and dizziness x 1 day, per pt she doesn't take any meds, PCP [place].

## 2023-04-26 NOTE — ED Provider Notes (Signed)
[facility] Provider Note   CSN: [id number] Arrival date & time: 04/26/23  1052     History  Chief Complaint  Patient presents with   Weakness    Stacy Chase is a 77 y.o. female.  Patient complains of feeling weak.  Patient was seen in urgent care and sent to the emergency department because she had orthostatic hypotension.  Patient reports that she began feeling weak and dizzy today.  Patient denies any headache she has not had any chest pain patient denies any abdominal pain.  Patient reports she does not feel sick she just feels weak.  Patient denies any exposure to flu or COVID.  Patient denies any weakness to any extremity.  Patient reports limited fluid intake she states normally she only drinks 2 cups of water a day.  She reports she has been eating normally  The history is provided by the patient and a relative. No language interpreter was used.  Weakness      Home Medications Prior to Admission medications   Medication Sig Start Date End Date Taking? Authorizing Provider  cephALEXin (KEFLEX) 500 MG capsule Take 1 capsule (500 mg total) by mouth 4 (four) times daily for 7 days. 04/26/23 05/03/23 Yes Elson Areas, PA-C      Allergies    Cortisone, Morphine, and Morphine and codeine    Review of Systems   Review of Systems  Neurological:  Positive for weakness.  All other systems reviewed and are negative.   Physical Exam Updated Vital Signs BP 112/68   Pulse 68   Temp 98.5 F (36.9 C)   Resp 20   Ht 5' 10.5" (1.791 m)   Wt 56.7 kg   SpO2 100%   BMI 17.68 kg/m  Physical Exam Vitals and nursing note reviewed.  Constitutional:      Appearance: She is well-developed.  HENT:     Head: Normocephalic.     Right Ear: External ear normal.     Left Ear: External ear normal.     Nose: Nose normal.     Mouth/Throat:     Mouth: Mucous membranes are moist.  Cardiovascular:     Rate and Rhythm: Normal rate.   Pulmonary:     Effort: Pulmonary effort is normal.  Abdominal:     General: There is no distension.  Musculoskeletal:        General: Normal range of motion.     Cervical back: Normal range of motion.  Skin:    General: Skin is warm.  Neurological:     General: No focal deficit present.     Mental Status: She is alert and oriented to person, place, and time.  Psychiatric:        Mood and Affect: Mood normal.     ED Results / Procedures / Treatments   Labs (all labs ordered are listed, but only abnormal results are displayed) Labs Reviewed  BASIC METABOLIC PANEL - Abnormal; Notable for the following components:      Result Value   Glucose, Bld 106 (*)    All other components within normal limits  URINALYSIS, ROUTINE W REFLEX MICROSCOPIC - Abnormal; Notable for the following components:   APPearance HAZY (*)    Protein, ur 30 (*)    Leukocytes,Ua LARGE (*)    Bacteria, UA RARE (*)    All other components within normal limits  CBC  CBG MONITORING, ED  TROPONIN I (HIGH SENSITIVITY)  EKG None  Radiology No results found.  Procedures Procedures    Medications Ordered in ED Medications - No data to display  ED Course/ Medical Decision Making/ A&P                                 Medical Decision Making Patient complains of feeling weak and feeling dizzy earlier today.  Amount and/or Complexity of Data Reviewed Independent Historian: caregiver    Details: Patient is here with a family member who is supportive Labs: ordered. Decision-making details documented in ED Course.    Details: Labs ordered reviewed and interpreted UA shows large leukocytes 21-50 white blood cells  Risk Prescription drug management. Risk Details: Encouraged to drink fluids.  Patient tolerated to 12 ounce cups of water.  Patient counseled on possible urinary tract infection.  Patient is given a prescription for Keflex.  She is advised to follow-up with her primary care physician for  recheck.  Patient is advised to increase her water intake at home.           Final Clinical Impression(s) / ED Diagnoses Final diagnoses:  Urinary tract infection without hematuria, site unspecified    Rx / DC Orders ED Discharge Orders          Ordered    cephALEXin (KEFLEX) 500 MG capsule  4 times daily        04/26/23 1823           An After Visit Summary was printed and given to the patient.    Elson Areas, [place] 04/26/23 1854    Eber Hong, MD 04/27/23 [phone]

## 2023-04-26 NOTE — ED Triage Notes (Signed)
Feels weak since yesterday.  States dizziness at times

## 2023-04-26 NOTE — ED Provider Notes (Signed)
[facility]    CSN: [id number] Arrival date & time: 04/26/23  1008      History   Chief Complaint No chief complaint on file.   HPI Stacy Chase is a 77 y.o. female.   The history is provided by the patient.   Patient presents for complaints of weakness, and intermittent dizziness.  Patient states symptoms have been present for at least 6 months, but worsened over the past 24 hours.  She denies chest pain, shortness of breath, difficulty breathing, abdominal pain, nausea, vomiting, or diarrhea.  Patient denies any previous past medical history.  She reports she has been drinking fluids, states that she drinks 1 or 2 cups of water each day.  Patient reports that she has seen her PCP for the same or similar symptoms in the past.  States that she did see her PCP last month, but this provider is unable to see notes or lab work from that visit.  Past Medical History:  Diagnosis Date   Medical history non-contributory     There are no problems to display for this patient.   Past Surgical History:  Procedure Laterality Date   CATARACT EXTRACTION W/PHACO Left 01/30/2017   Procedure: CATARACT EXTRACTION PHACO AND INTRAOCULAR LENS PLACEMENT (IOC);  Surgeon: Jethro Bolus, MD;  Location: [facility];  Service: Ophthalmology;  Laterality: Left;  CDE: 14.94   CATARACT EXTRACTION W/PHACO Right 03/06/2017   Procedure: CATARACT EXTRACTION PHACO AND INTRAOCULAR LENS PLACEMENT (IOC);  Surgeon: Jethro Bolus, MD;  Location: [facility];  Service: Ophthalmology;  Laterality: Right;  CDE: 5.93   CERVICAL POLYPECTOMY     COLONOSCOPY     COLONOSCOPY N/A 06/10/2014   Procedure: COLONOSCOPY;  Surgeon: Malissa Hippo, MD;  Location: [facility];  Service: Endoscopy;  Laterality: N/A;  930    OB History   No obstetric history on file.      Home Medications    Prior to Admission medications   Not on File    Family History Family History  Problem Relation Age of Onset   Hypertension  Mother    Diabetes Sister    Cancer Sister        breast   Hypertension Sister    Cancer Brother        throat   Hypertension Maternal Grandmother    Hypertension Maternal Grandfather    Diabetes Sister     Social History Social History   Tobacco Use   Smoking status: Never   Smokeless tobacco: Never  Vaping Use   Vaping status: Never Used  Substance Use Topics   Alcohol use: No   Drug use: No     Allergies   Cortisone, Morphine, and Morphine and codeine   Review of Systems Review of Systems Per HPI  Physical Exam Triage Vital Signs ED Triage Vitals  Encounter Vitals Group     BP 04/26/23 1018 128/76     Systolic BP Percentile --      Diastolic BP Percentile --      Pulse Rate 04/26/23 1018 67     Resp 04/26/23 1018 20     Temp 04/26/23 1018 (!) 95.8 F (35.4 C)     Temp Source 04/26/23 1018 Temporal     SpO2 04/26/23 1018 98 %     Weight --      Height --      Head Circumference --      Peak Flow --      Pain  Score 04/26/23 1021 0     Pain Loc --      Pain Education --      Exclude from Growth Chart --    Orthostatic VS for the past 24 hrs:  BP- Lying Pulse- Lying BP- Sitting Pulse- Sitting BP- Standing at 0 minutes Pulse- Standing at 0 minutes  04/26/23 1023 178/76 55 107/67 67 94/58 78    Updated Vital Signs BP 128/76 (BP Location: Right Arm)   Pulse 67   Temp (!) 95.8 F (35.4 C) (Temporal)   Resp 20   SpO2 98%   Visual Acuity Right Eye Distance:   Left Eye Distance:   Bilateral Distance:    Right Eye Near:   Left Eye Near:    Bilateral Near:     Physical Exam Vitals and nursing note reviewed.  Constitutional:      General: She is not in acute distress.    Appearance: Normal appearance.  HENT:     Head: Normocephalic.  Eyes:     Extraocular Movements: Extraocular movements intact.     Pupils: Pupils are equal, round, and reactive to light.  Cardiovascular:     Rate and Rhythm: Normal rate and regular rhythm.     Pulses:  Normal pulses.     Heart sounds: Normal heart sounds.  Pulmonary:     Effort: Pulmonary effort is normal.     Breath sounds: Normal breath sounds.  Abdominal:     General: Bowel sounds are normal.     Palpations: Abdomen is soft.  Musculoskeletal:     Cervical back: Normal range of motion.  Skin:    General: Skin is warm and dry.  Neurological:     General: No focal deficit present.     Mental Status: She is alert and oriented to person, place, and time.  Psychiatric:        Mood and Affect: Mood normal.        Behavior: Behavior normal.      UC Treatments / Results  Labs (all labs ordered are listed, but only abnormal results are displayed) Labs Reviewed - No data to display  EKG: Sinus Bradycardia, biatrial enlargement, no STEMI.    Radiology No results found.  Procedures Procedures (including critical care time)  Medications Ordered in UC Medications - No data to display  Initial Impression / Assessment and Plan / UC Course  I have reviewed the triage vital signs and the nursing notes.  Pertinent labs & imaging results that were available during my care of the patient were reviewed by me and considered in my medical decision making (see chart for details).  Patient presents for complaints of weakness and intermittent dizziness.  Orthostatics were performed, and patient moderate to severe postural hypotension.  BP lying was 178/76, and dropped to 107/67 with sitting.  Patient appears somewhat "cachectic", and has complained of weakness for the past several months.  EKG was performed which which showed sinus bradycardia, no STEMI.  Based on the patient's current presentation, and symptoms, feel patient should be seen in the emergency department for further evaluation.  Patient was advised of same, and she is in agreement with this plan of care.  Patient's family member is accompanying her, her vital signs are currently stable, patient is able to travel to the emergency  department via private vehicle.  Patient discharged to the emergency department.  Final Clinical Impressions(s) / UC Diagnoses   Final diagnoses:  Postural hypotension  Weakness generalized  Discharge Instructions   None    ED Prescriptions   None    PDMP not reviewed this encounter.   Abran Cantor, NP 04/26/23 1142

## 2023-04-26 NOTE — Discharge Instructions (Signed)
Return if any problems.  See your Physician for recheck

## 2023-04-30 NOTE — Plan of Care (Signed)
[facility] Tonsillectomy/Adenoidectomy, Postoperative PEDS care plan entered in error.

## 2023-06-20 NOTE — Progress Notes (Unsigned)
Stacy Chase, female    DOB: [birth year]    MRN: [id number]   Brief patient profile:  89  yobf  never smoker  referred to pulmonary clinic in [place]  06/22/2023 by ER  for sob  assoc with weak and dizzy x years typically happen no more than once a month can last for seconds to days      History of Present Illness  06/22/2023  Pulmonary/ 1st office eval/ Tianna Baus / [facility]  Chief Complaint  Patient presents with   Consult    SOB x 3 days.  Sudden onset.  No medications.   Dyspnea:  variable  but can last for days, on best days rake leaves, [employer] walking ok  Cough: none  Sleep:  bed is flat/ none  SABA use: none  02: none   No obvious day to day or daytime pattern/variability or assoc excess/ purulent sputum or mucus plugs or hemoptysis or cp or chest tightness, subjective wheeze or overt sinus or hb symptoms.    Also denies any obvious fluctuation of symptoms with weather or environmental changes or other aggravating or alleviating factors except as outlined above   No unusual exposure hx or h/o childhood pna/ asthma or knowledge of premature birth.  Current Allergies, Complete Past Medical History, Past Surgical History, Family History, and Social History were reviewed in [employer] record.  ROS  The following are not active complaints unless bolded Hoarseness, sore throat, dysphagia, dental problems, itching, sneezing,  nasal congestion or discharge of excess mucus or purulent secretions, ear ache,   fever, chills, sweats, unintended wt loss or wt gain, classically pleuritic or exertional cp,  orthopnea pnd or arm/hand swelling  or leg swelling, presyncope, palpitations, abdominal pain, anorexia, nausea, vomiting, diarrhea  or change in bowel habits or change in bladder habits, change in stools or change in urine, dysuria, hematuria,  rash, arthralgias, visual complaints, headache, numbness, weakness or ataxia or problems with walking or  coordination,  change in mood or  memory.              No outpatient medications prior to visit.   No facility-administered medications prior to visit.    Past Medical History:  Diagnosis Date   Medical history non-contributory       Objective:     BP 108/66 (BP Location: Left Arm, Patient Position: Sitting)   Pulse 78   Temp (!) 97.4 F (36.3 C) (Oral)   Ht 5\' 10"  (1.778 m)   Wt 97 lb 12.8 oz (44.4 kg)   SpO2 99%   BMI 14.03 kg/m   SpO2: 99 %   RA  SBP 70s standing.  Very thin bf in w/c does not look acutely ill, did not eat bfast this am    HEENT : Oropharynx  clear dentition ok      Nasal turbinates nl    NECK :  without  apparent JVD/ palpable Nodes/TM    LUNGS: no acc muscle use,  Nl contour chest which is clear to A and P bilaterally without cough on insp or exp maneuvers   CV:  RRR  no s3 or murmur or increase in P2, and no edema   ABD:  soft and nontender with nl inspiratory excursion in the supine position. No bruits or organomegaly appreciated   MS:  ext warm without deformities Or obvious joint restrictions  calf tenderness, cyanosis or clubbing    SKIN: warm and dry without lesions  NEURO:  alert, approp,  not oriented to day of week, knows it's not xmas yet. No obvious focal deficits/ no nystagmus   Labs ordered/ reviewed:      Chemistry      Component Value Date/Time   NA 139 06/22/2023 0947   K 3.5 06/22/2023 0947   CL 100 06/22/2023 0947   CO2 23 06/22/2023 0947   BUN 28 (H) 06/22/2023 0947   CREATININE 0.98 06/22/2023 0947      Component Value Date/Time   CALCIUM 9.8 06/22/2023 0947        Lab Results  Component Value Date   WBC 4.5 06/22/2023   HGB 11.3 06/22/2023   HCT 34.4 06/22/2023   MCV 93 06/22/2023   PLT 187 06/22/2023     No results found for: "DDIMER"    Lab Results  Component Value Date   TSH 0.460 06/22/2023       Lab Results  Component Value Date   ESRSEDRATE 15 06/22/2023          CEA     06/22/2023    2.6       Assessment   DOE (dyspnea on exertion) Onset ? Summer 2024 vs "years before"  comes and goes up to sev days at a time assoc with weak and dizzy spells  - 06/22/2023  RA walk 25 ft very unsteady, no sob  sats 100%   Appears to have accelerated geriatric decline on today's eval but insists she was out raking leaves 2 d prior to OV  which I could not corroborate (son confides she mostly just leans on the rake and that her symptoms are chronic x years)   Greatest concern is not doe but orthostatic  hypotension in setting of apparent cognitive decline and poor po intake. Will check labs and refer back to Dr Ouida Sills for further general medical eval and refer as appropriate.          Each maintenance medication was reviewed in detail including emphasizing most importantly the difference between maintenance and prns and under what circumstances the prns are to be triggered using an action plan format where appropriate.  Total time for H and P, chart review, counseling,  directly observing portions of ambulatory 02 saturation study/ and generating customized AVS unique to this office visit / same day charting > 60 min with new complex chronically ill pt with refractory symptoms.                                   Sandrea Hughs, MD 06/22/2023

## 2023-06-22 ENCOUNTER — Encounter: Payer: Self-pay | Admitting: Internal Medicine

## 2023-06-22 ENCOUNTER — Ambulatory Visit (HOSPITAL_COMMUNITY)
Admission: RE | Admit: 2023-06-22 | Discharge: 2023-06-22 | Disposition: A | Discharge status: Home / Self Care | Payer: Medicare Other | Source: Ambulatory Visit | Attending: Internal Medicine | Admitting: Internal Medicine

## 2023-06-22 ENCOUNTER — Ambulatory Visit (INDEPENDENT_AMBULATORY_CARE_PROVIDER_SITE_OTHER): Payer: Medicare Other | Admitting: Internal Medicine

## 2023-06-22 VITALS — BP 108/66 | HR 78 | Temp 97.4°F | Ht 70.0 in | Wt 97.8 lb

## 2023-06-22 DIAGNOSIS — R634 Abnormal weight loss: Secondary | ICD-10-CM

## 2023-06-22 DIAGNOSIS — R0609 Other forms of dyspnea: Secondary | ICD-10-CM | POA: Insufficient documentation

## 2023-06-22 NOTE — Assessment & Plan Note (Signed)
Onset ? Summer 2024 vs "years before"  comes and goes up to sev days at a time assoc with weak and dizzy spells  - 06/22/2023  RA walk 25 ft very unsteady, no sob  sats 100%   Appears to have accelerated geriatric decline on today's eval but insists she was out raking leaves 2 d prior to OV  which I could not corroborate.  Greatest concern is not doe but orthostatic  hypotension in setting of apparent cognitive decline and poor po intake. Will check labs and refer back to Dr Ouida Sills for further general medical eval and refer as appropriate.

## 2023-06-22 NOTE — Patient Instructions (Addendum)
Please remember to go to the lab department   for your tests - we will call you with the results when they are available.     Please remember to go to the  x-ray department  @  [facility] for your tests - we will call you with the results when they are available     Drink gatorade/ pick food you enjoy and if you get to where you can't eat or drink normally you will need to go back to the ER for further inpatient eval  if we can't get to the bottom of this problem by outpatient work up.  No pulmonary follow up is needed but I will direct you based on above studies what the next step is

## 2023-06-23 ENCOUNTER — Encounter: Payer: Self-pay | Admitting: Internal Medicine

## 2023-06-23 LAB — BASIC METABOLIC PANEL
BUN/Creatinine Ratio: 29 — ABNORMAL HIGH (ref 12–28)
BUN: 28 mg/dL — ABNORMAL HIGH (ref 8–27)
CO2: 23 mmol/L (ref 20–29)
Calcium: 9.8 mg/dL (ref 8.7–10.3)
Chloride: 100 mmol/L (ref 96–106)
Creatinine, Ser: 0.98 mg/dL (ref 0.57–1.00)
Glucose: 129 mg/dL — ABNORMAL HIGH (ref 70–99)
Potassium: 3.5 mmol/L (ref 3.5–5.2)
Sodium: 139 mmol/L (ref 134–144)
eGFR: 60 mL/min/{1.73_m2} (ref >59)

## 2023-06-23 LAB — CBC WITH DIFFERENTIAL/PLATELET
Basophils Absolute: 0 10*3/uL (ref 0.0–0.2)
Basos: 1 %
EOS (ABSOLUTE): 0.1 10*3/uL (ref 0.0–0.4)
Eos: 1 %
Hematocrit: 34.4 % (ref 34.0–46.6)
Hemoglobin: 11.3 g/dL (ref 11.1–15.9)
Immature Grans (Abs): 0 10*3/uL (ref 0.0–0.1)
Immature Granulocytes: 0 %
Lymphocytes Absolute: 1.4 10*3/uL (ref 0.7–3.1)
Lymphs: 32 %
MCH: 30.5 pg (ref 26.6–33.0)
MCHC: 32.8 g/dL (ref 31.5–35.7)
MCV: 93 fL (ref 79–97)
Monocytes Absolute: 0.4 10*3/uL (ref 0.1–0.9)
Monocytes: 10 %
Neutrophils Absolute: 2.5 10*3/uL (ref 1.4–7.0)
Neutrophils: 56 %
Platelets: 187 10*3/uL (ref 150–450)
RBC: 3.71 x10E6/uL — ABNORMAL LOW (ref 3.77–5.28)
RDW: 12.5 % (ref 11.7–15.4)
WBC: 4.5 10*3/uL (ref 3.4–10.8)

## 2023-06-23 LAB — CEA: CEA: 2.6 ng/mL (ref 0.0–4.7)

## 2023-06-23 LAB — SEDIMENTATION RATE: Sed Rate: 15 mm/h (ref 0–40)

## 2023-06-23 LAB — TSH: TSH: 0.46 u[IU]/mL (ref 0.450–4.500)

## 2023-06-28 ENCOUNTER — Other Ambulatory Visit (HOSPITAL_COMMUNITY): Payer: Self-pay | Admitting: Internal Medicine

## 2023-06-28 DIAGNOSIS — R634 Abnormal weight loss: Secondary | ICD-10-CM

## 2023-06-28 DIAGNOSIS — R627 Adult failure to thrive: Secondary | ICD-10-CM

## 2023-06-28 DIAGNOSIS — R6251 Failure to thrive (child): Secondary | ICD-10-CM | POA: Diagnosis not present

## 2023-07-16 ENCOUNTER — Telehealth: Payer: Self-pay | Admitting: Internal Medicine

## 2023-07-16 NOTE — Telephone Encounter (Signed)
 Spoke with patient's son, Rella Larve ([place]), and reviewed labs and cxr. He voiced understanding and will contact PCP for follow up. Nothing further needed at this time.

## 2023-07-16 NOTE — Telephone Encounter (Signed)
 Charmian Muff son would like results of patient's xray and labwork. Charmian Muff phone number is [phone].

## 2023-07-26 ENCOUNTER — Ambulatory Visit (HOSPITAL_COMMUNITY)
Admission: RE | Admit: 2023-07-26 | Discharge: 2023-07-26 | Disposition: A | Discharge status: Home / Self Care | Payer: Medicare Other | Source: Ambulatory Visit | Attending: Internal Medicine | Admitting: Internal Medicine

## 2023-07-26 DIAGNOSIS — R935 Abnormal findings on diagnostic imaging of other abdominal regions, including retroperitoneum: Secondary | ICD-10-CM | POA: Diagnosis not present

## 2023-07-26 DIAGNOSIS — R627 Adult failure to thrive: Secondary | ICD-10-CM | POA: Insufficient documentation

## 2023-07-26 DIAGNOSIS — R634 Abnormal weight loss: Secondary | ICD-10-CM | POA: Insufficient documentation

## 2023-07-26 MED ORDER — IOHEXOL 9 MG/ML PO SOLN
ORAL | Status: AC
  Filled 2023-07-26: qty 1000

## 2023-07-26 MED ORDER — IOHEXOL 300 MG/ML  SOLN
100.0000 mL | Freq: Once | INTRAMUSCULAR | Status: AC | PRN
  Administered 2023-07-26: 100 mL via INTRAVENOUS

## 2023-08-06 ENCOUNTER — Other Ambulatory Visit (HOSPITAL_COMMUNITY): Payer: Self-pay | Admitting: Internal Medicine

## 2023-08-06 DIAGNOSIS — D490 Neoplasm of unspecified behavior of digestive system: Secondary | ICD-10-CM

## 2023-08-15 ENCOUNTER — Ambulatory Visit (HOSPITAL_COMMUNITY)
Admission: RE | Admit: 2023-08-15 | Discharge: 2023-08-15 | Disposition: A | Discharge status: Home / Self Care | Payer: Medicare Other | Source: Ambulatory Visit | Attending: Internal Medicine | Admitting: Internal Medicine

## 2023-08-15 ENCOUNTER — Other Ambulatory Visit (HOSPITAL_COMMUNITY): Payer: Self-pay | Admitting: Internal Medicine

## 2023-08-15 DIAGNOSIS — D490 Neoplasm of unspecified behavior of digestive system: Secondary | ICD-10-CM | POA: Diagnosis not present

## 2023-08-15 DIAGNOSIS — C259 Malignant neoplasm of pancreas, unspecified: Secondary | ICD-10-CM | POA: Diagnosis not present

## 2023-08-15 DIAGNOSIS — K869 Disease of pancreas, unspecified: Secondary | ICD-10-CM | POA: Diagnosis not present

## 2023-08-15 MED ORDER — GADOBUTROL 1 MMOL/ML IV SOLN
6.0000 mL | Freq: Once | INTRAVENOUS | Status: AC | PRN
  Administered 2023-08-15: 6 mL via INTRAVENOUS

## 2023-11-07 DIAGNOSIS — H524 Presbyopia: Secondary | ICD-10-CM | POA: Diagnosis not present

## 2024-02-20 ENCOUNTER — Emergency Department (HOSPITAL_COMMUNITY)
Admission: EM | Admit: 2024-02-20 | Discharge: 2024-02-20 | Disposition: A | Discharge status: Home / Self Care | Attending: Emergency Medicine | Admitting: Emergency Medicine

## 2024-02-20 ENCOUNTER — Encounter (HOSPITAL_COMMUNITY): Payer: Self-pay | Admitting: Pharmacy Technician

## 2024-02-20 ENCOUNTER — Emergency Department (HOSPITAL_COMMUNITY)

## 2024-02-20 ENCOUNTER — Other Ambulatory Visit: Payer: Self-pay

## 2024-02-20 DIAGNOSIS — R531 Weakness: Secondary | ICD-10-CM | POA: Diagnosis not present

## 2024-02-20 DIAGNOSIS — E860 Dehydration: Secondary | ICD-10-CM | POA: Diagnosis not present

## 2024-02-20 DIAGNOSIS — R5381 Other malaise: Secondary | ICD-10-CM | POA: Diagnosis not present

## 2024-02-20 DIAGNOSIS — I10 Essential (primary) hypertension: Secondary | ICD-10-CM | POA: Diagnosis not present

## 2024-02-20 DIAGNOSIS — R42 Dizziness and giddiness: Secondary | ICD-10-CM | POA: Diagnosis not present

## 2024-02-20 DIAGNOSIS — I700 Atherosclerosis of aorta: Secondary | ICD-10-CM | POA: Diagnosis not present

## 2024-02-20 LAB — URINALYSIS, ROUTINE W REFLEX MICROSCOPIC
Bilirubin Urine: NEGATIVE
Glucose, UA: NEGATIVE mg/dL
Hgb urine dipstick: NEGATIVE
Ketones, ur: NEGATIVE mg/dL
Nitrite: NEGATIVE
Protein, ur: NEGATIVE mg/dL
Specific Gravity, Urine: 1.017 (ref 1.005–1.030)
pH: 7 (ref 5.0–8.0)

## 2024-02-20 LAB — COMPREHENSIVE METABOLIC PANEL WITH GFR
ALT: 17 U/L (ref 0–44)
AST: 24 U/L (ref 15–41)
Albumin: 4 g/dL (ref 3.5–5.0)
Alkaline Phosphatase: 68 U/L (ref 38–126)
Anion gap: 9 (ref 5–15)
BUN: 23 mg/dL (ref 8–23)
CO2: 27 mmol/L (ref 22–32)
Calcium: 9.5 mg/dL (ref 8.9–10.3)
Chloride: 103 mmol/L (ref 98–111)
Creatinine, Ser: 0.81 mg/dL (ref 0.44–1.00)
GFR, Estimated: >60 mL/min (ref >60)
Glucose, Bld: 89 mg/dL (ref 70–99)
Potassium: 3.7 mmol/L (ref 3.5–5.1)
Sodium: 139 mmol/L (ref 135–145)
Total Bilirubin: 1.2 mg/dL (ref 0.0–1.2)
Total Protein: 7.8 g/dL (ref 6.5–8.1)

## 2024-02-20 LAB — CBG MONITORING, ED: Glucose-Capillary: 83 mg/dL (ref 70–99)

## 2024-02-20 LAB — CBC
HCT: 35.1 % — ABNORMAL LOW (ref 36.0–46.0)
Hemoglobin: 11.5 g/dL — ABNORMAL LOW (ref 12.0–15.0)
MCH: 31.3 pg (ref 26.0–34.0)
MCHC: 32.8 g/dL (ref 30.0–36.0)
MCV: 95.4 fL (ref 80.0–100.0)
Platelets: 208 K/uL (ref 150–400)
RBC: 3.68 MIL/uL — ABNORMAL LOW (ref 3.87–5.11)
RDW: 13.3 % (ref 11.5–15.5)
WBC: 5.9 K/uL (ref 4.0–10.5)
nRBC: 0 % (ref 0.0–0.2)

## 2024-02-20 MED ORDER — SODIUM CHLORIDE 0.9 % IV BOLUS
500.0000 mL | Freq: Once | INTRAVENOUS | Status: AC
  Administered 2024-02-20 (×2): 500 mL via INTRAVENOUS

## 2024-02-20 MED ORDER — SODIUM CHLORIDE 0.9 % IV BOLUS
1000.0000 mL | Freq: Once | INTRAVENOUS | Status: AC
  Administered 2024-02-20 (×2): 1000 mL via INTRAVENOUS

## 2024-02-20 NOTE — ED Notes (Signed)
 Stacy Chase, son, [phone]

## 2024-02-20 NOTE — ED Triage Notes (Signed)
 Pt bib ems after son called for possible dehydration. Initially pt refused transport. The second time EMS was called pt agreed to be transported. Pt with no complaints other than feeling a little weaker the last few days.  129/52 HR 70 100% RA CBG 110 97.82F

## 2024-02-20 NOTE — Discharge Instructions (Addendum)
 Seen today for fatigue and generalized weakness.  Your workup is reassuring you are likely dehydrated, drink any fluids and rest, come back to the ER for new or worsening symptoms.  You did have some cells on your urine but you are having no symptoms so I have sent this for culture.  Follow-up closely with your PCP.

## 2024-02-20 NOTE — ED Provider Notes (Addendum)
  EMERGENCY DEPARTMENT AT [facility] Provider Note   CSN: [id number] Arrival date & time: 02/20/24  1606     Patient presents with: No chief complaint on file.   Stacy Chase is a 78 y.o. female.  She comes the ER via EMS for generalized fatigue that started today.  Stated she lives at home, has been her usual state of health but this morning she states she did not have any get up and go.  Denies any nausea or vomiting, no cough, no diarrhea, no abdominal pain.  No chest pain or shortness of breath.  She states she just feels like she does not have any energy.  Her son wanted her to come in. Denies urinary symptoms.  Patient has no exacerbating or relieving factors, denies prior episodes.  HPI     Prior to Admission medications   Not on File    Allergies: Cortisone, Morphine, and Morphine and codeine    Review of Systems  Updated Vital Signs BP (!) 145/58   Pulse 62   Temp 97.6 F (36.4 C)   SpO2 97%   Physical Exam Vitals and nursing note reviewed.  Constitutional:      General: She is not in acute distress.    Appearance: She is well-developed.  HENT:     Head: Normocephalic and atraumatic.     Mouth/Throat:     Mouth: Mucous membranes are dry.  Eyes:     Extraocular Movements: Extraocular movements intact.     Conjunctiva/sclera: Conjunctivae normal.     Pupils: Pupils are equal, round, and reactive to light.  Cardiovascular:     Rate and Rhythm: Normal rate and regular rhythm.     Heart sounds: No murmur heard. Pulmonary:     Effort: Pulmonary effort is normal. No respiratory distress.     Breath sounds: Normal breath sounds.  Abdominal:     Palpations: Abdomen is soft.     Tenderness: There is no abdominal tenderness. There is no right CVA tenderness, left CVA tenderness, guarding or rebound.  Musculoskeletal:        General: No swelling.     Cervical back: Neck supple.  Skin:    General: Skin is warm and dry.     Capillary  Refill: Capillary refill takes less than 2 seconds.  Neurological:     General: No focal deficit present.     Mental Status: She is alert and oriented to person, place, and time.  Psychiatric:        Mood and Affect: Mood normal.     (all labs ordered are listed, but only abnormal results are displayed) Labs Reviewed  CBC - Abnormal; Notable for the following components:      Result Value   RBC 3.68 (*)    Hemoglobin 11.5 (*)    HCT 35.1 (*)    All other components within normal limits  COMPREHENSIVE METABOLIC PANEL WITH GFR  URINALYSIS, ROUTINE W REFLEX MICROSCOPIC  CBG MONITORING, ED    EKG: EKG Interpretation Date/Time:  Wednesday February 20 2024 16:26:39 EDT Ventricular Rate:  59 PR Interval:  179 QRS Duration:  63 QT Interval:  469 QTC Calculation: 465 R Axis:   87  Text Interpretation: Sinus rhythm Atrial premature complexes in couplets Right atrial enlargement Borderline right axis deviation Abnrm T, consider ischemia, anterolateral lds no changes compared with prior Confirmed by Cleotilde Rogue ([id number]) on 02/20/2024 4:33:11 PM  Radiology: No results found.   Procedures  Medications Ordered in the ED - No data to display                                  Medical Decision Making This patient presents to the ED for concern of lysed fatigue and weakness, this involves an extensive number of treatment options, and is a complaint that carries with it a high risk of complications and morbidity.  The differential diagnosis includes traction, UTI, sepsis, electrolyte disturbance, anemia, other   Additional history obtained:  Additional history obtained from EMR External records from outside source obtained and reviewed including prior notes and lab     Imaging Studies ordered:  I ordered imaging studies including x-ray chest I independently visualized and interpreted imaging which showed no pulmonary edema or infiltrate I agree with the radiologist  interpretation     Problem List / ED Course / Critical interventions / Medication management Here for generalized weakness, has been ongoing for couple days especially worse today.  She has no focal symptoms, her son felt she was dehydrated want her to come to the ER.  No urinary symptoms, no abdominal pain or nausea or vomiting, no fever or chills.  No URI symptoms.  She has no white count, hemoglobin is 11.5 she is baseline for her, CMP was normal, she was given IV fluids feels much better.  She was orthostatic but able to ambulate and feeling much better after the fluid bolus.  Urinalysis did show trace leukocytes and 11-20 white blood cells with rare bacteria but she has no dysuria, frequency, urgency, no flank pain no suprapubic tenderness.  Discussed with patient we will send for culture he is going to follow-up closely with her PCP. I ordered medication including fluids for dehydration Reevaluation of the patient after these medicines showed that the patient resolved I have reviewed the patients home medicines and have made adjustments as needed    Test / Admission - Considered:  Considered admission-not indicated at this time, patient requesting discharge    Amount and/or Complexity of Data Reviewed Labs: ordered. Decision-making details documented in ED Course. Radiology: ordered.        Final diagnoses:  None    ED Discharge Orders     None          Suellen Sherran DELENA DEVONNA 02/20/24 2336    Suellen Sherran DELENA DEVONNA 02/20/24 2336    Cleotilde Rogue, MD 02/21/24 [phone]

## 2024-02-22 LAB — URINE CULTURE

## 2024-03-03 DIAGNOSIS — R634 Abnormal weight loss: Secondary | ICD-10-CM | POA: Diagnosis not present

## 2024-03-03 DIAGNOSIS — Z0001 Encounter for general adult medical examination with abnormal findings: Secondary | ICD-10-CM | POA: Diagnosis not present

## 2024-03-03 DIAGNOSIS — Z79899 Other long term (current) drug therapy: Secondary | ICD-10-CM | POA: Diagnosis not present

## 2024-03-03 DIAGNOSIS — F0390 Unspecified dementia without behavioral disturbance: Secondary | ICD-10-CM | POA: Diagnosis not present

## 2024-05-23 ENCOUNTER — Encounter (INDEPENDENT_AMBULATORY_CARE_PROVIDER_SITE_OTHER): Payer: Self-pay | Admitting: *Deleted
